# Patient Record
Sex: Female | Born: 1970
Health system: Southern US, Community
[De-identification: ages and names within clinical notes are randomized; demographics above are authoritative.]

## PROBLEM LIST (undated history)

## (undated) HISTORY — PX: LAPAROSCOPIC GASTRIC BANDING: SHX1100

---

## 1999-06-14 ENCOUNTER — Inpatient Hospital Stay (HOSPITAL_COMMUNITY): Admission: EM | Admit: 1999-06-14 | Discharge: 1999-06-14 | Payer: Self-pay | Admitting: Obstetrics

## 2004-02-29 ENCOUNTER — Emergency Department (HOSPITAL_COMMUNITY): Admission: EM | Admit: 2004-02-29 | Discharge: 2004-02-29 | Payer: Self-pay | Admitting: Emergency Medicine

## 2007-03-17 ENCOUNTER — Emergency Department (HOSPITAL_COMMUNITY): Admission: EM | Admit: 2007-03-17 | Discharge: 2007-03-17 | Payer: Self-pay | Admitting: Emergency Medicine

## 2007-05-29 ENCOUNTER — Encounter: Admission: RE | Admit: 2007-05-29 | Discharge: 2007-05-29 | Payer: Self-pay | Admitting: Internal Medicine

## 2007-08-29 ENCOUNTER — Ambulatory Visit (HOSPITAL_COMMUNITY): Admission: RE | Admit: 2007-08-29 | Discharge: 2007-08-29 | Payer: Self-pay | Admitting: Obstetrics

## 2007-08-29 ENCOUNTER — Encounter (INDEPENDENT_AMBULATORY_CARE_PROVIDER_SITE_OTHER): Payer: Self-pay | Admitting: Obstetrics

## 2007-12-12 ENCOUNTER — Emergency Department (HOSPITAL_COMMUNITY): Admission: EM | Admit: 2007-12-12 | Discharge: 2007-12-12 | Payer: Self-pay | Admitting: Emergency Medicine

## 2008-09-16 ENCOUNTER — Emergency Department (HOSPITAL_COMMUNITY): Admission: EM | Admit: 2008-09-16 | Discharge: 2008-09-16 | Payer: Self-pay | Admitting: Emergency Medicine

## 2008-09-23 ENCOUNTER — Emergency Department (HOSPITAL_COMMUNITY): Admission: EM | Admit: 2008-09-23 | Discharge: 2008-09-23 | Payer: Self-pay | Admitting: Emergency Medicine

## 2010-12-14 NOTE — Op Note (Signed)
Tiffany Ford, Tiffany Ford               ACCOUNT NO.:  [id number]   MEDICAL RECORD NO.:  [id number]          PATIENT TYPE:  AMB   LOCATION:  [facility]                           FACILITY:  [facility]   PHYSICIAN:  Kathreen Cosier, M.D.DATE OF BIRTH:  Dec 16, 1970   DATE OF PROCEDURE:  08/29/2007  DATE OF DISCHARGE:                               OPERATIVE REPORT   PREOPERATIVE DIAGNOSIS:  Dysfunctional bleeding.   PROCEDURE:  Hysteroscopy, D and C, NovaSure ablation.   SURGEON:  Kathreen Cosier, M.D.   ANESTHESIA:  MAC.   Using MAC with the patient in lithotomy position, perineum and vagina  prepped and draped, bladder emptied with straight catheter.  Bimanual  exam revealed the uterus to be enlarged with some myomas.  Speculum  placed in the vagina.  Cervix injected with 9 mL of 1% Xylocaine.  Endocervix curetted, small amount of tissue obtained.  Endometrial  cavity sounded 10 cm.  Cervical length measured 5 cm.  Cervix dilated  #27 Shawnie Pons.  Hysteroscope inserted, cavity was normal.  Hysteroscope  removed and sharp curettage done, large amount of tissue obtained.  The  NovaSure device was then inserted and the cavity integrity test passed.  Cavity width was 5 cm.  Ablation was performed for 64 seconds at 138  watts.  Post ablation hysteroscopy performed.  Cavity showed total  ablation.  The fluid deficit was 80 mL.  The patient tolerated the  procedure well, taken to the recovery room in good condition.           ______________________________  Kathreen Cosier, M.D.     BAM/MEDQ  D:  08/29/2007  T:  08/29/2007  Job:  [id number]

## 2011-04-21 LAB — CBC
HCT: 34 — ABNORMAL LOW
Hemoglobin: 11.3 — ABNORMAL LOW
Platelets: 292
RBC: 3.82 — ABNORMAL LOW
WBC: 7.4

## 2011-04-21 LAB — DIFFERENTIAL
Basophils Absolute: 0
Basophils Relative: 1
Eosinophils Absolute: 0.1
Eosinophils Relative: 1
Monocytes Absolute: 0.3

## 2015-06-04 ENCOUNTER — Other Ambulatory Visit: Payer: Self-pay

## 2015-06-04 DIAGNOSIS — Z1231 Encounter for screening mammogram for malignant neoplasm of breast: Secondary | ICD-10-CM

## 2015-07-03 ENCOUNTER — Ambulatory Visit: Payer: Self-pay

## 2015-08-07 ENCOUNTER — Ambulatory Visit
Admission: RE | Admit: 2015-08-07 | Discharge: 2015-08-07 | Disposition: A | Discharge status: Home / Self Care | Payer: BLUE CROSS/BLUE SHIELD | Source: Ambulatory Visit

## 2015-08-07 DIAGNOSIS — Z1231 Encounter for screening mammogram for malignant neoplasm of breast: Secondary | ICD-10-CM

## 2015-10-08 ENCOUNTER — Other Ambulatory Visit (HOSPITAL_COMMUNITY): Payer: Self-pay | Admitting: General Surgery

## 2015-10-23 ENCOUNTER — Ambulatory Visit (HOSPITAL_COMMUNITY)
Admission: RE | Admit: 2015-10-23 | Discharge: 2015-10-23 | Disposition: A | Discharge status: Home / Self Care | Payer: BLUE CROSS/BLUE SHIELD | Source: Ambulatory Visit | Attending: General Surgery | Admitting: General Surgery

## 2015-10-23 DIAGNOSIS — Z01818 Encounter for other preprocedural examination: Secondary | ICD-10-CM | POA: Insufficient documentation

## 2015-11-09 ENCOUNTER — Encounter: Payer: Self-pay | Admitting: Dietician

## 2015-11-09 ENCOUNTER — Encounter: Payer: BLUE CROSS/BLUE SHIELD | Attending: General Surgery | Admitting: Dietician

## 2015-11-09 NOTE — Patient Instructions (Signed)
Follow Pre-Op Goals Try Protein Shakes Call [facility] at [phone] when surgery is scheduled to enroll in Pre-Op Class  Things to remember:  Please always be honest with us. We want to support you!  If you have any questions or concerns in between appointments, please call or email Liz, Leslie, or Laurie.  The diet after surgery will be high protein and low in carbohydrate.  Vitamins and calcium need to be taken for the rest of your life.  Feel free to include support people in any classes or appointments.   Supplement recommendations:  Complete" Multivitamin: Sleeve Gastrectomy and RYGB patients take a double dose of MVI. LAGB patients take single dose as it is written on the package. Vitamin must be liquid or chewable but not gummy. Examples of these include Flintstones Complete and Centrum Complete. If the vitamin is bariatric-specific, take 1 dose as it is already formulated for bariatric surgery patients. Examples of these are Bariatric Advantage, Celebrate, and Wellesse. These can be found at the [facility] and/or online.     Calcium citrate: 1500 mg/day of Calcium citrate (also chewable or liquid) is recommended for all procedures. The body is only able to absorb 500-600 mg of Calcium at one time so 3 daily doses of 500 mg are recommended. Calcium doses must be taken a minimum of 2 hours apart. Additionally, Calcium must be taken 2 hours apart from iron-containing MVI. Examples of brands include Celebrate, Bariatric Advantage, and Wellesse. These brands must be purchased online or at the [facility]. Citracal Petites is the only Calcium citrate supplement found in general grocery stores and pharmacies. This is in tablet form and may be recommended for patients who do not tolerate chewable Calcium.  Continued or added Vitamin D supplementation based on individual needs.    Vitamin B12: 300-500 mcg/day for Sleeve Gastrectomy and RYGB. Optional for  LAGB patients as stomach remains fully intact. Must be taken intramuscularly, sublingually, or inhaled nasally. Oral route is not recommended. 

## 2015-11-09 NOTE — Progress Notes (Signed)
  Pre-Op Assessment Visit:  Pre-Operative Sleeve Gastrectomy Surgery  Medical Nutrition Therapy:  Appt start time: 0725 End time:  0750.  Patient was seen on 11/09/2015 for Pre-Operative Nutrition Assessment. Assessment and letter of approval [facility] coordinator on 11/09/2015.   Preferred Learning Style:   No preference indicated   Learning Readiness:   Ready  Handouts given during visit include:  Pre-Op Goals Bariatric Surgery Protein Shakes   During the appointment today the following Pre-Op Goals were reviewed with the patient: Maintain or lose weight as instructed by your surgeon Make healthy food choices Begin to limit portion sizes Limited concentrated sugars and fried foods Keep fat/sugar in the single digits per serving on   food labels Practice CHEWING your food  (aim for 30 chews per bite or until applesauce consistency) Practice not drinking 15 minutes before, during, and 30 minutes after each meal/snack Avoid all carbonated beverages  Avoid/limit caffeinated beverages  Avoid all sugar-sweetened beverages Consume 3 meals per day; eat every 3-5 hours Make a list of non-food related activities Aim for 64-100 ounces of FLUID daily  Aim for at least 60-80 grams of PROTEIN daily Look for a liquid protein source that contain ?15 g protein and ?5 g carbohydrate  (ex: shakes, drinks, shots)  Patient-Centered Goals: Would like to be more active with kids and feel less self conscious 10 confidence/10 importance scale   Demonstrated degree of understanding via:  Teach Back  Teaching Method Utilized:  Visual Auditory Hands on  Barriers to learning/adherence to lifestyle change: none  [facility] to [facility] the [facility] to enroll in Pre-Op and Post-Op Nutrition Education when surgery date is scheduled.

## 2016-09-13 ENCOUNTER — Other Ambulatory Visit: Payer: Self-pay | Admitting: Internal Medicine

## 2016-09-13 DIAGNOSIS — Z1231 Encounter for screening mammogram for malignant neoplasm of breast: Secondary | ICD-10-CM

## 2016-09-28 ENCOUNTER — Ambulatory Visit
Admission: RE | Admit: 2016-09-28 | Discharge: 2016-09-28 | Disposition: A | Discharge status: Home / Self Care | Payer: BLUE CROSS/BLUE SHIELD | Source: Ambulatory Visit | Attending: Internal Medicine | Admitting: Internal Medicine

## 2016-09-28 DIAGNOSIS — Z1231 Encounter for screening mammogram for malignant neoplasm of breast: Secondary | ICD-10-CM

## 2016-12-29 ENCOUNTER — Telehealth: Payer: Self-pay | Admitting: Podiatry

## 2016-12-29 ENCOUNTER — Ambulatory Visit (INDEPENDENT_AMBULATORY_CARE_PROVIDER_SITE_OTHER): Payer: BLUE CROSS/BLUE SHIELD

## 2016-12-29 ENCOUNTER — Ambulatory Visit (INDEPENDENT_AMBULATORY_CARE_PROVIDER_SITE_OTHER): Payer: BLUE CROSS/BLUE SHIELD | Admitting: Podiatry

## 2016-12-29 DIAGNOSIS — M722 Plantar fascial fibromatosis: Secondary | ICD-10-CM | POA: Diagnosis not present

## 2016-12-29 DIAGNOSIS — R52 Pain, unspecified: Secondary | ICD-10-CM | POA: Diagnosis not present

## 2016-12-29 DIAGNOSIS — M7750 Other enthesopathy of unspecified foot: Secondary | ICD-10-CM | POA: Diagnosis not present

## 2016-12-29 DIAGNOSIS — M19071 Primary osteoarthritis, right ankle and foot: Secondary | ICD-10-CM

## 2016-12-29 DIAGNOSIS — M779 Enthesopathy, unspecified: Secondary | ICD-10-CM

## 2016-12-29 MED ORDER — MELOXICAM 15 MG PO TABS: 15.0000 mg | ORAL_TABLET | Freq: Every day | ORAL | 2 refills | Status: DC

## 2016-12-29 MED ORDER — TRIAMCINOLONE ACETONIDE 10 MG/ML IJ SUSP: 10.0000 mg | Freq: Once | INTRAMUSCULAR | Status: DC

## 2016-12-29 NOTE — Telephone Encounter (Addendum)
Left message informing pt the Meloxicam had been ordered [facility] before 5:00[facility]. Pt called states she could not hear the message due to static, and I informed pt the rx had been called to her pharmacy.

## 2016-12-29 NOTE — Telephone Encounter (Signed)
I saw Dr. Wagoner this morning and he was supposed to call in Meloxicam (Mobic) Tiffany Anton15 MG tablet [facility] [address] and I wanted to know when it is going to be called in so I can go pick it up. Please call me back.

## 2016-12-29 NOTE — Addendum Note (Signed)
Addended by: Ovid CurdWAGONER, MATTHEW R on: 12/29/2016 12:53 PM   Modules accepted: Orders

## 2016-12-29 NOTE — Progress Notes (Addendum)
Subjective:    Patient ID: Tiffany Ford, female   DOB: 46 y.o.   MRN: [id number]   HPI 46 year old female presents the office today for concerns of foot pain on the right foot. She states this has been ongoing for greater than one year and she points to the top of her foot on the medial aspect where she is majority discomfort. Chest is some pain on the inside aspect of her ankle. She denies any recent injury or trauma. She has a remote history of ankle sprain back in her 4120s but not recently. She denies any numbness or tingling. The pain does not wake up at night. She denies any claudication symptoms. She has no other concerns today.   Review of Systems  All other systems reviewed and are negative.       Objective:  Physical Exam General: AAO x3, NAD  Dermatological: Skin is warm, dry and supple bilateral. Nails x 10 are well manicured; remaining integument appears unremarkable at this time. There are no open sores, no preulcerative lesions, no rash or signs of infection present.  Vascular: Dorsalis Pedis artery and Posterior Tibial artery pedal pulses are 2/4 bilateral with immedate capillary fill time. Pedal hair growth present. No varicosities and no lower extremity edema present bilateral. There is no pain with calf compression, swelling, warmth, erythema.   Neruologic: Grossly intact via light touch bilateral. Vibratory intact via tuning fork bilateral. Protective threshold with Semmes Wienstein monofilament intact to all pedal sites bilateral. Patellar and Achilles deep tendon reflexes 2+ bilateral. No Babinski or clonus noted bilateral.   Musculoskeletal:There is tenderness on the dorsal aspect of the medial right foot on the talonavicular joint and small palpable exostosis is probable off this area. There is some trace edema in this area without any erythema or increase in warmth. There is also some mild tenderness along the course of the posterior tibial tendon just posterior and  inferior to the medial malleolus. There is trace edema to this area but is no erythema or increase in warmth. Decrease in medial arch. Equinus present. There is minimal discomfort with sesamoid complex but no swelling or redness. There is minimal discomfort on the medial arch of the foot on the plantar fascia No evidence of acute fracture. MMT 5/5.  Assessment: Right talonavicular joint osteoarthritis, posterior tibial tendon dysfunction due to flatfoot deformity; with sesamoiditis and mild plantar fasciitis  Plan: -Treatment options discussed including all alternatives, risks, and complications -Etiology of symptoms were discussed -X-rays were obtained and reviewed with the patient. Arthritic changes present in the talar navicular joint. No evidence of acute fracture. -Discussed steroid injection therapy tenderness and she wishes to proceed. Under sterile conditions a mature Kenalog and local anesthetic was infiltrated into the area of maximal tenderness without complications. Post injection care was discussed. -Tri-Lock ankle brace was dispensed Prescribed mobic. Discussed side effects of the medication and directed to stop if any are to occur and call the office.  -She was molded for orthotics today. Orthotics ordered and [facility].  -RTC in 3 weeks or sooner if needed.  Ovid CurdMatthew Avry Roedl, DPM

## 2017-01-19 ENCOUNTER — Encounter: Payer: Self-pay | Admitting: Podiatry

## 2017-01-19 ENCOUNTER — Ambulatory Visit (INDEPENDENT_AMBULATORY_CARE_PROVIDER_SITE_OTHER): Payer: BLUE CROSS/BLUE SHIELD | Admitting: Podiatry

## 2017-01-19 DIAGNOSIS — M722 Plantar fascial fibromatosis: Secondary | ICD-10-CM

## 2017-01-19 DIAGNOSIS — M19071 Primary osteoarthritis, right ankle and foot: Secondary | ICD-10-CM

## 2017-01-19 NOTE — Patient Instructions (Signed)

## 2017-01-19 NOTE — Progress Notes (Signed)
Patient presents today to [facility]. She was seen by the assistant today and she declined an appointment with myself today. Recommended 4 week follow-up to check inserts or sooner if any questions or isssues arise. Directed on on how to break-in the inserts.   Ovid CurdMatthew Yuriel Lopezmartinez, DPM

## 2017-02-16 ENCOUNTER — Ambulatory Visit: Payer: BLUE CROSS/BLUE SHIELD | Admitting: Podiatry

## 2017-02-27 ENCOUNTER — Ambulatory Visit: Payer: BLUE CROSS/BLUE SHIELD | Admitting: Podiatry

## 2017-06-08 ENCOUNTER — Ambulatory Visit: Payer: Self-pay | Admitting: General Surgery

## 2017-07-07 ENCOUNTER — Other Ambulatory Visit (HOSPITAL_COMMUNITY): Payer: Self-pay | Admitting: General Surgery

## 2017-07-26 ENCOUNTER — Ambulatory Visit (HOSPITAL_COMMUNITY)
Admission: RE | Admit: 2017-07-26 | Discharge: 2017-07-26 | Disposition: A | Discharge status: Home / Self Care | Payer: BLUE CROSS/BLUE SHIELD | Source: Ambulatory Visit | Attending: General Surgery | Admitting: General Surgery

## 2017-07-26 ENCOUNTER — Other Ambulatory Visit: Payer: Self-pay

## 2017-07-26 DIAGNOSIS — K8020 Calculus of gallbladder without cholecystitis without obstruction: Secondary | ICD-10-CM | POA: Diagnosis not present

## 2017-07-26 DIAGNOSIS — R9431 Abnormal electrocardiogram [ECG] [EKG]: Secondary | ICD-10-CM | POA: Insufficient documentation

## 2017-07-26 DIAGNOSIS — Z01810 Encounter for preprocedural cardiovascular examination: Secondary | ICD-10-CM | POA: Insufficient documentation

## 2017-07-26 DIAGNOSIS — K9509 Other complications of gastric band procedure: Secondary | ICD-10-CM | POA: Insufficient documentation

## 2017-07-31 ENCOUNTER — Encounter: Payer: BLUE CROSS/BLUE SHIELD | Attending: General Surgery | Admitting: Registered"

## 2017-07-31 ENCOUNTER — Encounter: Payer: Self-pay | Admitting: Registered"

## 2017-07-31 ENCOUNTER — Encounter (HOSPITAL_COMMUNITY): Payer: Self-pay | Admitting: *Deleted

## 2017-07-31 ENCOUNTER — Other Ambulatory Visit: Payer: Self-pay

## 2017-07-31 ENCOUNTER — Ambulatory Visit (HOSPITAL_COMMUNITY)
Admission: EM | Admit: 2017-07-31 | Discharge: 2017-07-31 | Disposition: A | Discharge status: Home / Self Care | Payer: BLUE CROSS/BLUE SHIELD | Attending: Emergency Medicine | Admitting: Emergency Medicine

## 2017-07-31 DIAGNOSIS — E669 Obesity, unspecified: Secondary | ICD-10-CM

## 2017-07-31 DIAGNOSIS — S76219A Strain of adductor muscle, fascia and tendon of unspecified thigh, initial encounter: Secondary | ICD-10-CM | POA: Diagnosis not present

## 2017-07-31 DIAGNOSIS — Z713 Dietary counseling and surveillance: Secondary | ICD-10-CM | POA: Insufficient documentation

## 2017-07-31 MED ORDER — NAPROXEN 375 MG PO TABS: 375.0000 mg | ORAL_TABLET | Freq: Two times a day (BID) | ORAL | 0 refills | Status: DC

## 2017-07-31 MED ORDER — TRAMADOL HCL 50 MG PO TABS: 50.0000 mg | ORAL_TABLET | Freq: Four times a day (QID) | ORAL | 0 refills | Status: DC | PRN

## 2017-07-31 NOTE — ED Provider Notes (Signed)
[facility]    CSN: [id number] Arrival date & time: 07/31/17  1101     History   Chief Complaint No chief complaint on file.   HPI Tiffany Ford is a 46 y.o. female.   46 year old obese female states that about 2 months ago she was attempting to get into a [employer] and she felt a pulling in the muscle of the right inner thigh. She saw her doctor, obtained a pelvic x-ray read negative. She thought she was getting a little better, was taking meloxicam that more recently after walking more than usual she has developed more pain. Pain is located to the medial aspect of the thigh and to the quadriceps.      History reviewed. No pertinent past medical history.  There are no active problems to display for this patient.   Past Surgical History:  Procedure Laterality Date  . LAPAROSCOPIC GASTRIC BANDING      OB History    No data available       Home Medications    Prior to Admission medications   Medication Sig Start Date End Date Taking? Authorizing Provider  phentermine 15 MG capsule Take 15 mg by mouth every morning.   Yes [provider]  naproxen (NAPROSYN) 375 MG tablet Take 1 tablet (375 mg total) by mouth 2 (two) times daily. 07/31/17   Hayden RasmussenMabe, Nevan Creighton, NP  traMADol (ULTRAM) 50 MG tablet Take 1 tablet (50 mg total) by mouth every 6 (six) hours as needed. 07/31/17   Hayden RasmussenMabe, Amaliya Whitelaw, NP    Family History Family History  Problem Relation Age of Onset  . Hypertension Other   . Diabetes Other   . Heart disease Other     Social History Social History   Tobacco Use  . Smoking status: Never Smoker  . Smokeless tobacco: Never Used  Substance Use Topics  . Alcohol use: No    Frequency: Never  . Drug use: No     Allergies   Patient has no known allergies.   Review of Systems Review of Systems  Constitutional: Negative.  Negative for activity change, chills and fever.  HENT: Negative.   Respiratory: Negative.   Cardiovascular: Negative.     Musculoskeletal: Positive for myalgias.       As per HPI  Skin: Negative for color change, pallor and rash.  Neurological: Negative.   All other systems reviewed and are negative.    Physical Exam Triage Vital Signs ED Triage Vitals  Enc Vitals Group     BP 07/31/17 1206 136/64     Pulse Rate 07/31/17 1206 70     Resp --      Temp 07/31/17 1206 98.4 F (36.9 C)     Temp Source 07/31/17 1206 Oral     SpO2 07/31/17 1206 100 %     Weight --      Height --      Head Circumference --      Peak Flow --      Pain Score 07/31/17 1207 9     Pain Loc --      Pain Edu? --      Excl. in GC? --    No data found.  Updated Vital Signs BP 136/64 (BP Location: Left Arm)   Pulse 70   Temp 98.4 F (36.9 C) (Oral)   LMP 07/05/2017   SpO2 100%   Visual Acuity Right Eye Distance:   Left Eye Distance:   Bilateral Distance:  Right Eye Near:   Left Eye Near:    Bilateral Near:     Physical Exam  Constitutional: She is oriented to person, place, and time. She appears well-developed and well-nourished. No distress.  HENT:  Head: Normocephalic and atraumatic.  Eyes: EOM are normal. Pupils are equal, round, and reactive to light.  Neck: Normal range of motion. Neck supple.  Musculoskeletal: She exhibits tenderness. She exhibits no edema or deformity.  Tenderness over the right rock small adductor muscles. Minor tenderness over the quadriceps. Able to flex at the hip. No bony tenderness.  Lymphadenopathy:    She has no cervical adenopathy.  Neurological: She is alert and oriented to person, place, and time. No cranial nerve deficit.  Skin: Skin is warm and dry.  Nursing note and vitals reviewed.    UC Treatments / Results  Labs (all labs ordered are listed, but only abnormal results are displayed) Labs Reviewed - No data to display  EKG  EKG Interpretation None       Radiology No results found.  Procedures Procedures (including critical care time)  Medications  Ordered in UC Medications - No data to display   Initial Impression / Assessment and Plan / UC Course  I have reviewed the triage vital signs and the nursing notes.  Pertinent labs & imaging results that were available during my care of the patient were reviewed by me and considered in my medical decision making (see chart for details).    Less walking. Apply heat as directed take medication as directed and follow-up with primary care provider.    Final Clinical Impressions(s) / UC Diagnoses   Final diagnoses:  Strain of adductor muscle of thigh    ED Discharge Orders        Ordered    naproxen (NAPROSYN) 375 MG tablet  2 times daily     07/31/17 1256    traMADol (ULTRAM) 50 MG tablet  Every 6 hours PRN     07/31/17 1256       Controlled Substance Prescriptions [place] Controlled Substance Registry consulted? Not Applicable   Hayden RasmussenMabe, Jen Benedict, NP 07/31/17 1258

## 2017-07-31 NOTE — ED Triage Notes (Signed)
Per pt her right lower pelvic is hurting, per pt she went to PCP and they did xray and was given meds for this pain, per pt she is still having shooting pain that's radiating to her rt knee,

## 2017-07-31 NOTE — Discharge Instructions (Signed)
Less walking. Apply heat as directed take medication as directed and follow-up with primary care provider.

## 2017-07-31 NOTE — Progress Notes (Signed)
Pre-Op Assessment Visit:  Pre-Operative LAGB to Sleeve gastrectomy Surgery  Medical Nutrition Therapy:  Appt start time: 9:00  End time:  10:05  Patient was seen on 07/31/2017 for Pre-Operative Nutrition Assessment. Assessment and letter of approval faxed to [facility] coordinator on 07/31/2017.   Pt expectation of surgery: wants to healthy  Pt expectation of Dietitian: help along the way; do's and don'ts; restrictions  Start weight at [facility]: 202.1 BMI: 41.17   Pt states LAGB bothers her really bad; very uncomfortable. Pt wants what is best for her, not substitutions for shakes, etc.   Pt is unsure of the amount of visits needed, if any. Pt states she will find out from [facility].   24 hr Dietary Recall: First Meal: sometimes skips; sausage link Snack: none Second Meal: sometimes skips; 2 drummettes, okra Snack: none Third Meal: pig feet, rice, steamed cabbage, cornbread Snack: chocolate Beverages: water, Vitamin water, coffee, Premier protein clear drink  Encouraged to engage in 75 minutes of moderate physical activity including cardiovascular and weight baring weekly  Handouts given during visit include:  . Pre-Op Goals . Bariatric Surgery Protein Shakes . Vitamin and Mineral Recommendations  During the appointment today the following Pre-Op Goals were reviewed with the patient: . Track your food and beverage: MyFitness Pal or Baritastic App . Make healthy food choices . Begin to limit portion sizes . Limited concentrated sugars and fried foods . Keep fat/sugar in the single digits per serving on          food labels . Practice CHEWING your food  (aim for 30 chews per bite or until applesauce consistency) . Practice not drinking 15 minutes before, during, and 30 minutes after each meal/snack . Avoid all carbonated beverages  . Avoid/limit caffeinated beverages  . Avoid all sugar-sweetened beverages . Avoid alcohol . Consume 3 meals per  day; eat every 3-5 hours . Make a list of non-food related activities . Aim for 64-100 ounces of FLUID daily  . Aim for at least 60-80 grams of PROTEIN daily . Look for a liquid protein source that contain ?15 g protein and ?5 g carbohydrate  (ex: shakes, drinks, shots) . Physical activity is an important part of a healthy lifestyle so keep it moving!  Follow diet recommendations listed below Energy and Macronutrient Recommendations: Calories: 1600 Carbohydrate: 180 Protein: 120 Fat: 44  Demonstrated degree of understanding via:  Teach Back   Teaching Method Utilized:  Visual Auditory Hands on  Barriers to learning/adherence to lifestyle change: none  Patient to call the [facility] to enroll in Pre-Op and Post-Op Nutrition Education when surgery date is scheduled.

## 2017-08-22 ENCOUNTER — Other Ambulatory Visit: Payer: Self-pay | Admitting: Internal Medicine

## 2017-08-22 DIAGNOSIS — Z1231 Encounter for screening mammogram for malignant neoplasm of breast: Secondary | ICD-10-CM

## 2017-09-02 ENCOUNTER — Ambulatory Visit (HOSPITAL_COMMUNITY)
Admission: EM | Admit: 2017-09-02 | Discharge: 2017-09-02 | Disposition: A | Discharge status: Home / Self Care | Payer: BLUE CROSS/BLUE SHIELD | Attending: Emergency Medicine | Admitting: Emergency Medicine

## 2017-09-02 ENCOUNTER — Other Ambulatory Visit: Payer: Self-pay

## 2017-09-02 ENCOUNTER — Encounter (HOSPITAL_COMMUNITY): Payer: Self-pay | Admitting: *Deleted

## 2017-09-02 DIAGNOSIS — R6889 Other general symptoms and signs: Secondary | ICD-10-CM | POA: Diagnosis not present

## 2017-09-02 MED ORDER — ACETAMINOPHEN 325 MG PO TABS
650.0000 mg | ORAL_TABLET | Freq: Once | ORAL | Status: AC
Start: 2017-09-02 — End: 2017-09-02
  Administered 2017-09-02: 650 mg via ORAL

## 2017-09-02 MED ORDER — OSELTAMIVIR PHOSPHATE 75 MG PO CAPS: 75.0000 mg | ORAL_CAPSULE | Freq: Two times a day (BID) | ORAL | 0 refills | Status: DC

## 2017-09-02 MED ORDER — ACETAMINOPHEN 325 MG PO TABS
ORAL_TABLET | ORAL | Status: AC
  Filled 2017-09-02: qty 2

## 2017-09-02 NOTE — ED Provider Notes (Signed)
[facility]    CSN: [id number] Arrival date & time: 09/02/17  1618     History   Chief Complaint Chief Complaint  Patient presents with  . Headache  . Chills    HPI Tiffany Ford is a 47 y.o. female.  patient is presenting today with generalized malaise with headache, cough, aches, chills and chest congestion for approximately 2 days. Patient states she's had no nausea vomiting or diarrhea. Patient states that her daughter was recently diagnosed with flu and she thinks she "gave it to me". States she's not been taking anything other than Mucinex for her symptoms with no relief.  HPI  History reviewed. No pertinent past medical history.  There are no active problems to display for this patient.   Past Surgical History:  Procedure Laterality Date  . LAPAROSCOPIC GASTRIC BANDING      OB History    No data available       Home Medications    Prior to Admission medications   Medication Sig Start Date End Date Taking? Authorizing Provider  naproxen (NAPROSYN) 375 MG tablet Take 1 tablet (375 mg total) by mouth 2 (two) times daily. 07/31/17   Hayden Rasmussen, NP  oseltamivir (TAMIFLU) 75 MG capsule Take 1 capsule (75 mg total) by mouth every 12 (twelve) hours. 09/02/17   Servando Salina, NP  phentermine 15 MG capsule Take 15 mg by mouth every morning.    [provider]  traMADol (ULTRAM) 50 MG tablet Take 1 tablet (50 mg total) by mouth every 6 (six) hours as needed. 07/31/17   Hayden Rasmussen, NP    Family History Family History  Problem Relation Age of Onset  . Hypertension Other   . Diabetes Other   . Heart disease Other     Social History Social History   Tobacco Use  . Smoking status: Never Smoker  . Smokeless tobacco: Never Used  Substance Use Topics  . Alcohol use: No    Frequency: Never  . Drug use: No     Allergies   Patient has no known allergies.   Review of Systems Review of Systems  Constitutional: Positive for chills,  fatigue and fever.  HENT: Positive for congestion.   Eyes: Negative.  Negative for visual disturbance.  Respiratory: Positive for cough. Negative for shortness of breath and wheezing.   Cardiovascular: Negative.  Negative for chest pain and leg swelling.  Gastrointestinal: Negative.  Negative for abdominal pain, constipation, diarrhea, nausea and vomiting.  Endocrine: Negative.   Genitourinary: Negative.   Musculoskeletal: Negative.  Negative for gait problem and neck stiffness.  Skin: Negative.  Negative for rash.  Allergic/Immunologic: Negative.   Neurological: Negative.  Negative for dizziness and headaches.  Hematological: Negative.   Psychiatric/Behavioral: Negative.      Physical Exam Triage Vital Signs ED Triage Vitals  Enc Vitals Group     BP 09/02/17 1712 (!) 161/98     Pulse Rate 09/02/17 1712 78     Resp 09/02/17 1712 (!) 24     Temp 09/02/17 1712 99.6 F (37.6 C)     Temp Source 09/02/17 1712 Oral     SpO2 09/02/17 1712 98 %     Weight --      Height --      Head Circumference --      Peak Flow --      Pain Score 09/02/17 1715 10     Pain Loc --      Pain Edu? --  Excl. in GC? --    No data found.  Updated Vital Signs BP (!) 161/98 (BP Location: Left Arm) Comment: Notified Octavia  Pulse 78   Temp 98.6 F (37 C) (Oral)   Resp (!) 24 Comment: Notified Octavia  SpO2 98%       Physical Exam  Constitutional: She appears well-developed and well-nourished. No distress.  HENT:  Head: Normocephalic and atraumatic.  Mouth/Throat: Oropharynx is clear and moist.  Eyes: Conjunctivae are normal. Right eye exhibits no discharge. Left eye exhibits no discharge.  Neck: Normal range of motion. Neck supple. No thyromegaly present.  Negative cervical lymphadenopathy. Negative for nuchal rigidity  Cardiovascular: Normal rate, regular rhythm, normal heart sounds and intact distal pulses. Exam reveals no gallop and no friction rub.  No murmur  heard. Pulmonary/Chest: Effort normal and breath sounds normal. No stridor. No respiratory distress. She has no wheezes. She has no rales. She exhibits no tenderness.  Abdominal: Soft. Bowel sounds are normal. She exhibits no distension and no mass. There is no tenderness. There is no guarding.  Neurological: She is alert. She has normal strength.  Skin: She is not diaphoretic.  Nursing note and vitals reviewed.    UC Treatments / Results  Labs (all labs ordered are listed, but only abnormal results are displayed) Labs Reviewed - No data to display  EKG  EKG Interpretation None       Radiology No results found.  Procedures Procedures (including critical care time)  Medications Ordered in UC Medications  acetaminophen (TYLENOL) tablet 650 mg (650 mg Oral Given 09/02/17 1733)     Initial Impression / Assessment and Plan / UC Course  I have reviewed the triage vital signs and the nursing notes.  Pertinent labs & imaging results that were available during my care of the patient were reviewed by me and considered in my medical decision making (see chart for details).   Discussed flu treatment options.  Advised patient not to return to work until 24 hours fever free.  Work note offered.   Final Clinical Impressions(s) / UC Diagnoses   Final diagnoses:  Flu-like symptoms    ED Discharge Orders        Ordered    oseltamivir (TAMIFLU) 75 MG capsule  Every 12 hours     09/02/17 1729       Controlled Substance Prescriptions [place] Controlled Substance Registry consulted? Not Applicable   The usual and customary discharge instructions and warnings were given.  The patient verbalizes understanding and agrees to plan of care.      Servando Salinaossi, Natale Thoma H, NP 09/02/17 1756

## 2017-09-02 NOTE — Discharge Instructions (Signed)
Do not return to work until fever free for 24 hours.

## 2017-09-02 NOTE — ED Triage Notes (Addendum)
Headache, sore throat, cough, chills, sneezing, Facial pressure, fatigue, some body aches,

## 2017-09-12 ENCOUNTER — Ambulatory Visit: Payer: BLUE CROSS/BLUE SHIELD | Admitting: Physical Therapy

## 2017-09-18 ENCOUNTER — Ambulatory Visit: Payer: BLUE CROSS/BLUE SHIELD | Attending: Internal Medicine | Admitting: Physical Therapy

## 2017-09-18 ENCOUNTER — Other Ambulatory Visit: Payer: Self-pay

## 2017-09-18 ENCOUNTER — Encounter: Payer: Self-pay | Admitting: Physical Therapy

## 2017-09-18 DIAGNOSIS — M6281 Muscle weakness (generalized): Secondary | ICD-10-CM | POA: Diagnosis present

## 2017-09-18 DIAGNOSIS — R262 Difficulty in walking, not elsewhere classified: Secondary | ICD-10-CM | POA: Diagnosis present

## 2017-09-18 DIAGNOSIS — R1031 Right lower quadrant pain: Secondary | ICD-10-CM | POA: Diagnosis present

## 2017-09-18 NOTE — Therapy (Signed)
[facility] Outpatient Rehabilitation [facility] [address] [place], [place], [address] Phone: [phone]   Fax:  [phone]  Physical Therapy Evaluation  Patient Details  Name: Tiffany Ford MRN: [id number] Date of Birth: Jun 22, 1971 Referring Provider: Fleet Contras, MD   Encounter Date: 09/18/2017  PT End of Session - 09/18/17 1123    Visit Number  1    Number of Visits  13    Date for PT Re-Evaluation  11/03/17    Authorization Type  BCBS 30 visit limit    PT Start Time  1123    PT Stop Time  1218    PT Time Calculation (min)  55 min    Activity Tolerance  Patient tolerated treatment well    Behavior During Therapy  [facility] for tasks assessed/performed       History reviewed. No pertinent past medical history.  Past Surgical History:  Procedure Laterality Date  . LAPAROSCOPIC GASTRIC BANDING      There were no vitals filed for this visit.   Subjective Assessment - 09/18/17 1129    Subjective  Feels pain in Rght groin that can be unbearable. Went to [place] in October and rented a car. When she placed her Rt foot on the side of the car to step in she felt the pull. Denies bruising following. Was told by MD at urgent care that she should be taking baby steps rather than long strides. Denies any change in pain since injury. Pain to knee that runs along whole thigh. Pain moves around to lateral hip but not to back.     How long can you sit comfortably?  unlimited    How long can you stand comfortably?  3 min    How long can you walk comfortably?  immediate discomfort    Patient Stated Goals  turn over in bed, stand to chart at work    Currently in Pain?  No/denies    Pain Score  -- up to 9/10 in last 3 days    Pain Location  -- thigh    Pain Orientation  Proximal;Mid    Pain Descriptors / Indicators  Sharp;Sore    Aggravating Factors   walking    Pain Relieving Factors  lay in bed         [facility] PT Assessment - 09/18/17 0001      Assessment    Medical Diagnosis  Rt hip and thigh pain    Referring Provider  Fleet Contras, MD    Onset Date/Surgical Date  -- Oct 2018    Hand Dominance  Right      Precautions   Precautions  None      Restrictions   Weight Bearing Restrictions  No      Balance Screen   Has the patient fallen in the past 6 months  No      Home Environment   Additional Comments  no stairs at home      Prior Function   Vocation Requirements  patient care at [employer]   Overall Cognitive Status  Within Functional Limits for tasks assessed      Observation/Other Assessments   Focus on Therapeutic Outcomes (FOTO)   -- episode was discharged prior to eval      Sensation   Additional Comments  WFL      ROM / Strength   AROM / PROM / Strength  PROM;Strength      PROM   Overall PROM  Comments  pain with passive adduction, end range flexion, ER, IR end ranges      Strength   Strength Assessment Site  Hip    Right/Left Hip  Right    Right Hip Flexion  3/5    Right Hip ABduction  4/5    Right Hip ADduction  -- pain      Palpation   Palpation comment  proximal adductor tendons TTP, pain with heel slide, no pain with resisted adduction, no TTP quad/adductor muscle bellies or illiopsoas      Special Tests   Other special tests  single leg- rt hip drop when standing on Lt leg, Rt lateral trunk lean in Rt SLS      Ambulation/Gait   Gait Comments  antalgic noted in Left stance phase with Rt hip drop             Objective measurements completed on examination: See above findings.      [facility] Adult PT Treatment/Exercise - 09/18/17 0001      Exercises   Exercises  Knee/Hip      Knee/Hip Exercises: Sidelying   Hip ABduction  Both;15 reps      Knee/Hip Exercises: Prone   Other Prone Exercises  abdominal activation + glut set      Modalities   Modalities  Cryotherapy      Cryotherapy   Number Minutes Cryotherapy  10 Minutes    Cryotherapy Location  Hip Rt groin    Type of  Cryotherapy  Ice pack             PT Education - 09/18/17 1236    Education provided  Yes    Education Details  anatomy of condition, POC, HEP, exercise form/rationale, modalities, rest breaks    Person(s) Educated  Patient    Methods  Explanation;Verbal cues;Handout    Comprehension  Verbalized understanding;Verbal cues required;Need further instruction          PT Long Term Goals - 09/18/17 1232      PT LONG TERM GOAL #1   Title  Gross LE strength to 5/5 for proper support to biomechanical chain    Baseline  see flowsheet    Time  6    Period  Weeks    Status  New    Target Date  11/03/17      PT LONG TERM GOAL #2   Title  Pt will be able to stand to chart at work without limitation by groin pain    Baseline  comfortable for about 3 minutes at eval    Time  6    Period  Weeks    Status  New    Target Date  11/03/17      PT LONG TERM GOAL #3   Title  Pt will report ability to "walk normally" and taking comfortable stride lengths    Baseline  taking baby steps in antalgic pattern    Time  6    Period  Weeks    Status  New    Target Date  11/03/17      PT LONG TERM GOAL #4   Title  Pt will be able to perform bed mobility and sit<>stand transfers without increased pain    Baseline  pain at eval    Time  6    Period  Weeks    Status  New    Target Date  11/03/17  Plan - 09/18/17 1220    Clinical Impression Statement  Pt presents to PT with complaints of Rt hip/thigh pain that began in October when she was stepping into a rented [place]. Pain with palpation to proximal adductor tendon and pain with stretching adductors. No insertion point pain upon palpation. S/s consistent with tendon strain. Notable weakness in Lt hip in CKC motions as well. Pt was asked to alternate ice and heat as well as take rest breaks at work to avoid reaching high levels of pain. Pt will benefit from skilled PT in order to stabilize lumbopelvic region to decrease unequal  stress on pelvis and manage pain in tendons.     Clinical Presentation  Stable    Clinical Decision Making  Low    Rehab Potential  Good    PT Frequency  2x / week    PT Duration  6 weeks    PT Treatment/Interventions  ADLs/Self Care Home Management;Cryotherapy;Electrical Stimulation;Ultrasound;Moist Heat;Gait training;Stair training;Functional mobility training;Therapeutic activities;Therapeutic exercise;Balance training;Patient/family education;Neuromuscular re-education;Manual techniques;Passive range of motion;Dry needling;Taping    PT Next Visit Plan  ultrasound proximal adductors. lumbopelvic stability    PT Home Exercise Plan  prone core/glut sets, sidelying hip abduction    Consulted and Agree with Plan of Care  Patient       Patient will benefit from skilled therapeutic intervention in order to improve the following deficits and impairments:  Abnormal gait, Pain, Improper body mechanics, Decreased activity tolerance, Decreased strength, Difficulty walking  Visit Diagnosis: Pain in the groin, right - Plan: PT plan of care cert/re-cert  Muscle weakness (generalized) - Plan: PT plan of care cert/re-cert  Difficulty in walking, not elsewhere classified - Plan: PT plan of care cert/re-cert     Problem List There are no active problems to display for this patient.  Tiffany Coin C. Tiffany Ford PT, DPT 09/18/17 12:41 PM   [facility] Outpatient Rehabilitation [facility] [address] [place], [place], [address] Phone: [phone]   Fax:  [phone]  Name: Tiffany Ford MRN: [id number] Date of Birth: 08/28/1970

## 2017-09-19 ENCOUNTER — Ambulatory Visit: Payer: BLUE CROSS/BLUE SHIELD | Admitting: Physical Therapy

## 2017-09-19 ENCOUNTER — Telehealth: Payer: Self-pay | Admitting: Physical Therapy

## 2017-09-19 NOTE — Telephone Encounter (Signed)
MESSAGE LEFT ON MACHINE ABOUT MISSED VISIT.  INFORMED OF HER NEXT APPOINTMENT.  PHONE NUMBER LEFT FOR her to call if she needs to cancel or no longer needs PT.  Referred her to the attendance policy for missed visits.   Liz BeachKaren harris PTA

## 2017-09-28 ENCOUNTER — Encounter: Payer: Self-pay | Admitting: Physical Therapy

## 2017-09-28 ENCOUNTER — Ambulatory Visit: Payer: BLUE CROSS/BLUE SHIELD | Admitting: Physical Therapy

## 2017-09-28 DIAGNOSIS — R262 Difficulty in walking, not elsewhere classified: Secondary | ICD-10-CM

## 2017-09-28 DIAGNOSIS — M6281 Muscle weakness (generalized): Secondary | ICD-10-CM

## 2017-09-28 DIAGNOSIS — R1031 Right lower quadrant pain: Secondary | ICD-10-CM

## 2017-09-28 NOTE — Therapy (Addendum)
[place], [place], [address] Phone: [phone]   Fax:  [phone]  Physical Therapy Treatment/Discharge Summary  Patient Details  Name: Tiffany Ford MRN: [id number] Date of Birth: July 18, 1971 Referring Provider: Nolene Ebbs, MD   Encounter Date: 09/28/2017  PT End of Session - 09/28/17 1059    Visit Number  2    Number of Visits  13    Date for PT Re-Evaluation  11/03/17    Authorization Type  BCBS 30 visit limit    PT Start Time  1103    PT Stop Time  1113    PT Time Calculation (min)  10 min    Activity Tolerance  Patient tolerated treatment well    Behavior During Therapy  [facility] for tasks assessed/performed       History reviewed. No pertinent past medical history.  Past Surgical History:  Procedure Laterality Date  . LAPAROSCOPIC GASTRIC BANDING      There were no vitals filed for this visit.  Subjective Assessment - 09/28/17 1103    Subjective  The ice pack was my best friend. I did not do exercises because I was so tired after work.     Patient Stated Goals  turn over in bed, stand to chart at work    Currently in Pain?  No/denies                      [facility] Adult PT Treatment/Exercise - 09/28/17 0001      Knee/Hip Exercises: Aerobic   Nustep  5 min L5 LE only                  PT Long Term Goals - 09/18/17 1232      PT LONG TERM GOAL #1   Title  Gross LE strength to 5/5 for proper support to biomechanical chain    Baseline  see flowsheet    Time  6    Period  Weeks    Status  New    Target Date  11/03/17      PT LONG TERM GOAL #2   Title  Pt will be able to stand to chart at work without limitation by groin pain    Baseline  comfortable for about 3 minutes at eval    Time  6    Period  Weeks    Status  New    Target Date  11/03/17      PT LONG TERM GOAL #3   Title  Pt will report ability to "walk normally" and taking comfortable stride  lengths    Baseline  taking baby steps in antalgic pattern    Time  6    Period  Weeks    Status  New    Target Date  11/03/17      PT LONG TERM GOAL #4   Title  Pt will be able to perform bed mobility and sit<>stand transfers without increased pain    Baseline  pain at eval    Time  6    Period  Weeks    Status  New    Target Date  11/03/17            Plan - 09/28/17 1113    Clinical Impression Statement  Pt ended session due to daughter being sick at school and had to pick her up.     PT Treatment/Interventions  ADLs/Self Care Home Management;Cryotherapy;Electrical Stimulation;Ultrasound;Moist Heat;Gait training;Stair  training;Functional mobility training;Therapeutic activities;Therapeutic exercise;Balance training;Patient/family education;Neuromuscular re-education;Manual techniques;Passive range of motion;Dry needling;Taping    PT Next Visit Plan  ultrasound proximal adductors. lumbopelvic stability    PT Home Exercise Plan  prone core/glut sets, sidelying hip abduction    Consulted and Agree with Plan of Care  Patient       Patient will benefit from skilled therapeutic intervention in order to improve the following deficits and impairments:  Abnormal gait, Pain, Improper body mechanics, Decreased activity tolerance, Decreased strength, Difficulty walking  Visit Diagnosis: Pain in the groin, right  Muscle weakness (generalized)  Difficulty in walking, not elsewhere classified     Problem List There are no active problems to display for this patient.   Ysabelle Goodroe C. Shaylen Nephew PT, DPT 09/28/17 11:16 AM   [facility] [address] [place], [place], [address] Phone: [phone]   Fax:  [phone]  Name: Tiffany Ford MRN: [id number] Date of Birth: 1971/07/12  PHYSICAL THERAPY DISCHARGE SUMMARY  Visits from Start of Care: 2  Current functional level related to goals / functional outcomes: See above    Remaining deficits: See above   Education / Equipment: Anatomy of condition, POC, HEP, exercise form/rationale  Plan: Patient agrees to discharge.  Patient goals were not met. Patient is being discharged due to not returning since the last visit.  ?????    Too many NS, d/c per attendance policy.  Giuseppina Quinones C. Ivin Rosenbloom PT, DPT 11/06/17 11:16 AM

## 2017-09-29 ENCOUNTER — Ambulatory Visit: Payer: BLUE CROSS/BLUE SHIELD

## 2017-10-02 ENCOUNTER — Telehealth: Payer: Self-pay | Admitting: Physical Therapy

## 2017-10-02 ENCOUNTER — Ambulatory Visit: Payer: BLUE CROSS/BLUE SHIELD | Attending: Internal Medicine | Admitting: Physical Therapy

## 2017-10-02 NOTE — Telephone Encounter (Signed)
Left message about 2nd no show visit. Informed her of the next scheduled appointment date and time.  Also informed her that all other  appointments will be cancelled and she will be able to schedule one appointment at a time.  Our number was given for her to call and cancel if she no longer needs PT or if she needs to cancel. Tiffany BeachKaren Veatrice Ford PTA

## 2017-10-05 ENCOUNTER — Ambulatory Visit: Payer: BLUE CROSS/BLUE SHIELD | Admitting: Physical Therapy

## 2017-10-09 ENCOUNTER — Encounter: Payer: BLUE CROSS/BLUE SHIELD | Admitting: Physical Therapy

## 2017-10-16 ENCOUNTER — Encounter: Payer: BLUE CROSS/BLUE SHIELD | Admitting: Physical Therapy

## 2017-10-19 ENCOUNTER — Encounter: Payer: BLUE CROSS/BLUE SHIELD | Admitting: Physical Therapy

## 2017-10-20 ENCOUNTER — Ambulatory Visit
Admission: RE | Admit: 2017-10-20 | Discharge: 2017-10-20 | Disposition: A | Discharge status: Home / Self Care | Payer: BLUE CROSS/BLUE SHIELD | Source: Ambulatory Visit | Attending: Internal Medicine | Admitting: Internal Medicine

## 2017-10-20 DIAGNOSIS — Z1231 Encounter for screening mammogram for malignant neoplasm of breast: Secondary | ICD-10-CM

## 2017-10-23 ENCOUNTER — Encounter: Payer: BLUE CROSS/BLUE SHIELD | Admitting: Physical Therapy

## 2017-10-26 ENCOUNTER — Encounter: Payer: BLUE CROSS/BLUE SHIELD | Admitting: Physical Therapy

## 2017-10-30 ENCOUNTER — Encounter: Payer: BLUE CROSS/BLUE SHIELD | Admitting: Physical Therapy

## 2017-11-02 ENCOUNTER — Encounter: Payer: BLUE CROSS/BLUE SHIELD | Admitting: Physical Therapy

## 2017-11-23 ENCOUNTER — Ambulatory Visit: Payer: Self-pay | Admitting: General Surgery

## 2017-11-24 NOTE — Progress Notes (Signed)
07-26-17 (Epic) EKG, CXR

## 2017-11-24 NOTE — Patient Instructions (Addendum)
Tiffany LittenSamantha N Ford  11/24/2017   Your procedure is scheduled on: 12-04-17   [facility]  Entrance Report to Admitting at 9:15 AM    Call this number if you have problems the morning of surgery [phone]   Remember: MORNING OF SURGERY DRINK:  1SHAKE BEFORE YOU LEAVE HOME, DRINK ALL OF THE SHAKE AT ONE TIME.   NO SOLID FOOD AFTER 600 PM THE NIGHT BEFORE YOUR SURGERY. YOU MAY DRINK CLEAR FLUIDS. THE SHAKE YOU DRINK BEFORE YOU LEAVE HOME WILL BE THE LAST FLUIDS YOU DRINK BEFORE SURGERY 8:15 AM.  PAIN IS EXPECTED AFTER SURGERY AND WILL NOT BE COMPLETELY ELIMINATED. AMBULATION AND TYLENOL WILL HELP REDUCE INCISIONAL AND GAS PAIN. MOVEMENT IS KEY!  YOU ARE EXPECTED TO BE OUT OF BED WITHIN 4 HOURS OF ADMISSION TO YOUR PATIENT ROOM.  SITTING IN THE RECLINER THROUGHOUT THE DAY IS IMPORTANT FOR DRINKING FLUIDS AND MOVING GAS THROUGHOUT THE GI TRACT.  COMPRESSION STOCKINGS SHOULD BE WORN [facility] UNLESS YOU ARE WALKING.   INCENTIVE SPIROMETER SHOULD BE USED EVERY HOUR WHILE AWAKE TO DECREASE POST-OPERATIVE COMPLICATIONS SUCH AS PNEUMONIA.  WHEN DISCHARGED HOME, IT IS IMPORTANT TO CONTINUE TO WALK EVERY HOUR AND USE THE INCENTIVE SPIROMETER EVERY HOUR.           Take these medicines the morning of surgery with A SIP OF WATER: None                      CLEAR LIQUID DIET   Foods Allowed                                                                     Foods Excluded  Coffee and tea, regular and decaf                             liquids that you cannot  Plain Jell-O in any flavor                                             see through such as: Fruit ices (not with fruit pulp)                                     milk, soups, orange juice  Iced Popsicles                                    All solid food Carbonated beverages, regular and diet                                    Cranberry, grape and apple juices Sports drinks like  Gatorade Lightly seasoned clear broth or consume(fat free) Sugar, honey syrup  Sample Menu Breakfast  Lunch                                     Supper Cranberry juice                    Beef broth                            Chicken broth Jell-O                                     Grape juice                           Apple juice Coffee or tea                        Jell-O                                      Popsicle                                                Coffee or tea                        Coffee or tea  _____________________________________________________________________               [name] may not have any metal on your body including hair pins and              piercings  Do not wear jewelry, make-up, lotions, powders or perfumes, deodorant             Do not wear nail polish.  Do not shave  48 hours prior to surgery.                 Do not bring valuables to the hospital. [name] IS NOT             RESPONSIBLE   FOR VALUABLES.  Contacts, dentures or bridgework may not be worn into surgery.  Leave suitcase in the car. After surgery it may be brought to your room.     Special Instructions: N/A              Please read over the following fact sheets you were given: _____________________________________________________________________             [facility] - Preparing for Surgery Before surgery, you can play an important role.  Because skin is not sterile, your skin needs to be as free of germs as possible.  You can reduce the number of germs on your skin by washing with CHG (chlorahexidine gluconate) soap before surgery.  CHG is an antiseptic cleaner which kills germs and bonds with the skin to continue killing germs even after washing. Please DO NOT use if you have an allergy to CHG or antibacterial soaps.  If your skin becomes reddened/irritated stop using the CHG and inform your nurse when you arrive at Short Stay. Do not  shave  (including legs and underarms) for at least 48 hours prior to the first CHG shower.  You may shave your face/neck. Please follow these instructions carefully:  1.  Shower with CHG Soap the night before surgery and the  morning of Surgery.  2.  If you choose to wash your hair, wash your hair first as usual with your  normal  shampoo.  3.  After you shampoo, rinse your hair and body thoroughly to remove the  shampoo.                           4.  Use CHG as you would any other liquid soap.  You can apply chg directly  to the skin and wash                       Gently with a scrungie or clean washcloth.  5.  Apply the CHG Soap to your body ONLY FROM THE NECK DOWN.   Do not use on face/ open                           Wound or open sores. Avoid contact with eyes, ears mouth and genitals (private parts).                       Wash face,  Genitals (private parts) with your normal soap.             6.  Wash thoroughly, paying special attention to the area where your surgery  will be performed.  7.  Thoroughly rinse your body with warm water from the neck down.  8.  DO NOT shower/wash with your normal soap after using and rinsing off  the CHG Soap.                9.  Pat yourself dry with a clean towel.            10.  Wear clean pajamas.            11.  Place clean sheets on your bed the night of your first shower and do not  sleep with pets. Day of Surgery : Do not apply any lotions/deodorants the morning of surgery.  Please wear clean clothes to the hospital/surgery center.  FAILURE TO FOLLOW THESE INSTRUCTIONS MAY RESULT IN THE CANCELLATION OF YOUR SURGERY PATIENT SIGNATURE_________________________________  NURSE SIGNATURE__________________________________  ________________________________________________________________________  WHAT IS A BLOOD TRANSFUSION? Blood Transfusion Information  A transfusion is the replacement of blood or some of its parts. Blood is made up of multiple cells which  provide different functions.  Red blood cells carry oxygen and are used for blood loss replacement.  White blood cells fight against infection.  Platelets control bleeding.  Plasma helps clot blood.  Other blood products are available for specialized needs, such as hemophilia or other clotting disorders. BEFORE THE TRANSFUSION  Who gives blood for transfusions?   Healthy volunteers who are fully evaluated to make sure their blood is safe. This is blood bank blood. Transfusion therapy is the safest it has ever been in the practice of medicine. Before blood is taken from a donor, a complete history is taken to make sure that person has no history of diseases nor engages in risky social behavior (examples are intravenous drug use or sexual activity with multiple partners). The donor's travel history is  screened to minimize risk of transmitting infections, such as malaria. The donated blood is tested for signs of infectious diseases, such as HIV and hepatitis. The blood is then tested to be sure it is compatible with you in order to minimize the chance of a transfusion reaction. If you or a relative donates blood, this is often done in anticipation of surgery and is not appropriate for emergency situations. It takes many days to process the donated blood. RISKS AND COMPLICATIONS Although transfusion therapy is very safe and saves many lives, the main dangers of transfusion include:   Getting an infectious disease.  Developing a transfusion reaction. This is an allergic reaction to something in the blood you were given. Every precaution is taken to prevent this. The decision to have a blood transfusion has been considered carefully by your caregiver before blood is given. Blood is not given unless the benefits outweigh the risks. AFTER THE TRANSFUSION  Right after receiving a blood transfusion, you will usually feel much better and more energetic. This is especially true if your red blood cells  have gotten low (anemic). The transfusion raises the level of the red blood cells which carry oxygen, and this usually causes an energy increase.  The nurse administering the transfusion will monitor you carefully for complications. HOME CARE INSTRUCTIONS  No special instructions are needed after a transfusion. You may find your energy is better. Speak with your caregiver about any limitations on activity for underlying diseases you may have. SEEK MEDICAL CARE IF:   Your condition is not improving after your transfusion.  You develop redness or irritation at the intravenous (IV) site. SEEK IMMEDIATE MEDICAL CARE IF:  Any of the following symptoms occur over the next 12 hours:  Shaking chills.  You have a temperature by mouth above 102 F (38.9 C), not controlled by medicine.  Chest, back, or muscle pain.  People around you feel you are not acting correctly or are confused.  Shortness of breath or difficulty breathing.  Dizziness and fainting.  You get a rash or develop hives.  You have a decrease in urine output.  Your urine turns a dark color or changes to pink, red, or brown. Any of the following symptoms occur over the next 10 days:  You have a temperature by mouth above 102 F (38.9 C), not controlled by medicine.  Shortness of breath.  Weakness after normal activity.  The white part of the eye turns yellow (jaundice).  You have a decrease in the amount of urine or are urinating less often.  Your urine turns a dark color or changes to pink, red, or brown. Document Released: 07/15/2000 Document Revised: 10/10/2011 Document Reviewed: 03/03/2008 [facility] Patient Information 2014 [place], [place].  _______________________________________________________________________

## 2017-11-27 ENCOUNTER — Encounter (HOSPITAL_COMMUNITY): Payer: Self-pay

## 2017-11-27 ENCOUNTER — Other Ambulatory Visit: Payer: Self-pay

## 2017-11-27 ENCOUNTER — Encounter: Payer: BLUE CROSS/BLUE SHIELD | Attending: General Surgery | Admitting: Skilled Nursing Facility1

## 2017-11-27 ENCOUNTER — Encounter (HOSPITAL_COMMUNITY)
Admission: RE | Admit: 2017-11-27 | Discharge: 2017-11-27 | Disposition: A | Discharge status: Home / Self Care | Payer: BLUE CROSS/BLUE SHIELD | Source: Ambulatory Visit | Attending: General Surgery | Admitting: General Surgery

## 2017-11-27 ENCOUNTER — Encounter (INDEPENDENT_AMBULATORY_CARE_PROVIDER_SITE_OTHER): Payer: Self-pay

## 2017-11-27 DIAGNOSIS — Z6841 Body Mass Index (BMI) 40.0 and over, adult: Secondary | ICD-10-CM | POA: Diagnosis not present

## 2017-11-27 DIAGNOSIS — Z713 Dietary counseling and surveillance: Secondary | ICD-10-CM | POA: Insufficient documentation

## 2017-11-27 DIAGNOSIS — Z01812 Encounter for preprocedural laboratory examination: Secondary | ICD-10-CM | POA: Insufficient documentation

## 2017-11-27 DIAGNOSIS — K8020 Calculus of gallbladder without cholecystitis without obstruction: Secondary | ICD-10-CM | POA: Insufficient documentation

## 2017-11-27 DIAGNOSIS — E669 Obesity, unspecified: Secondary | ICD-10-CM

## 2017-11-27 LAB — CBC WITH DIFFERENTIAL/PLATELET
Basophils Absolute: 0 10*3/uL (ref 0.0–0.1)
Basophils Relative: 0 %
EOS ABS: 0.2 10*3/uL (ref 0.0–0.7)
Eosinophils Relative: 2 %
HCT: 39.2 % (ref 36.0–46.0)
HEMOGLOBIN: 13 g/dL (ref 12.0–15.0)
LYMPHS ABS: 2.2 10*3/uL (ref 0.7–4.0)
LYMPHS PCT: 24 %
MCH: 30.9 pg (ref 26.0–34.0)
MCHC: 33.2 g/dL (ref 30.0–36.0)
MCV: 93.1 fL (ref 78.0–100.0)
Monocytes Absolute: 0.5 10*3/uL (ref 0.1–1.0)
Monocytes Relative: 6 %
NEUTROS PCT: 68 %
Neutro Abs: 6.2 10*3/uL (ref 1.7–7.7)
Platelets: 264 10*3/uL (ref 150–400)
RBC: 4.21 MIL/uL (ref 3.87–5.11)
RDW: 14.2 % (ref 11.5–15.5)
WBC: 9.1 10*3/uL (ref 4.0–10.5)

## 2017-11-27 LAB — COMPREHENSIVE METABOLIC PANEL
ALK PHOS: 70 U/L (ref 38–126)
ALT: 12 U/L — AB (ref 14–54)
AST: 17 U/L (ref 15–41)
Albumin: 3.6 g/dL (ref 3.5–5.0)
Anion gap: 8 (ref 5–15)
BUN: 16 mg/dL (ref 6–20)
CALCIUM: 8.7 mg/dL — AB (ref 8.9–10.3)
CO2: 23 mmol/L (ref 22–32)
CREATININE: 0.63 mg/dL (ref 0.44–1.00)
Chloride: 108 mmol/L (ref 101–111)
GFR calc non Af Amer: >60 mL/min (ref >60)
GLUCOSE: 83 mg/dL (ref 65–99)
Potassium: 4.3 mmol/L (ref 3.5–5.1)
SODIUM: 139 mmol/L (ref 135–145)
Total Bilirubin: 0.7 mg/dL (ref 0.3–1.2)
Total Protein: 7.2 g/dL (ref 6.5–8.1)

## 2017-11-27 LAB — ABO/RH: ABO/RH(D): O POS

## 2017-11-29 ENCOUNTER — Encounter: Payer: Self-pay | Admitting: Skilled Nursing Facility1

## 2017-11-29 NOTE — Progress Notes (Signed)
Pre-Operative Nutrition Class:  Appt start time: 5859   End time:  1830.  Patient was seen on 11/27/2017 for Pre-Operative Bariatric Surgery [facility]   Surgery date:  Surgery type: Band to sleeve [facility] at [facility]: 202 Weight today: 210  Samples given per MNT protocol. Patient educated on appropriate usage: Procare Multivitamin Lot #  [id number] Exp: 01/2019  Renee Pain Protein  Shake Lot # [id number] Exp: 07/march/2020  The following the learning objectives were met by the patient during this course:  Identify Pre-Op Dietary Goals and will begin 2 weeks pre-operatively  Identify appropriate sources of fluids and proteins   State protein recommendations and appropriate sources pre and post-operatively  Identify Post-Operative Dietary Goals and will follow for 2 weeks post-operatively  Identify appropriate multivitamin and calcium sources  Describe the need for physical activity post-operatively and will follow MD recommendations  State when to call healthcare provider regarding medication questions or post-operative complications  Handouts given during class include:  Pre-Op Bariatric Surgery Diet Handout  Protein Shake Handout  Post-Op Bariatric Surgery Nutrition Handout  BELT Program Information Flyer  Support Group Information Flyer  [facility] Bariatric Supplements Price List  Follow-Up Plan: Patient will follow-up at [facility] 2 weeks post operatively for diet advancement per MD.

## 2017-12-04 ENCOUNTER — Encounter (HOSPITAL_COMMUNITY)
Admission: RE | Disposition: A | Discharge status: Home / Self Care | Payer: Self-pay | Source: Ambulatory Visit | Attending: General Surgery

## 2017-12-04 ENCOUNTER — Inpatient Hospital Stay (HOSPITAL_COMMUNITY): Payer: BLUE CROSS/BLUE SHIELD

## 2017-12-04 ENCOUNTER — Other Ambulatory Visit: Payer: Self-pay

## 2017-12-04 ENCOUNTER — Encounter (HOSPITAL_COMMUNITY): Payer: Self-pay | Admitting: *Deleted

## 2017-12-04 ENCOUNTER — Inpatient Hospital Stay (HOSPITAL_COMMUNITY): Payer: BLUE CROSS/BLUE SHIELD | Admitting: Anesthesiology

## 2017-12-04 ENCOUNTER — Inpatient Hospital Stay (HOSPITAL_COMMUNITY)
Admission: RE | Admit: 2017-12-04 | Discharge: 2017-12-06 | DRG: 327 | Disposition: A | Discharge status: Home / Self Care | Payer: BLUE CROSS/BLUE SHIELD | Source: Ambulatory Visit | Attending: General Surgery | Admitting: General Surgery

## 2017-12-04 DIAGNOSIS — Y831 Surgical operation with implant of artificial internal device as the cause of abnormal reaction of the patient, or of later complication, without mention of misadventure at the time of the procedure: Secondary | ICD-10-CM | POA: Diagnosis present

## 2017-12-04 DIAGNOSIS — K8020 Calculus of gallbladder without cholecystitis without obstruction: Secondary | ICD-10-CM | POA: Diagnosis present

## 2017-12-04 DIAGNOSIS — Z6841 Body Mass Index (BMI) 40.0 and over, adult: Secondary | ICD-10-CM

## 2017-12-04 DIAGNOSIS — K9509 Other complications of gastric band procedure: Secondary | ICD-10-CM | POA: Diagnosis present

## 2017-12-04 HISTORY — PX: LAPAROSCOPIC GASTRIC BAND REMOVAL WITH LAPAROSCOPIC GASTRIC SLEEVE RESECTION: SHX6498

## 2017-12-04 HISTORY — PX: CHOLECYSTECTOMY: SHX55

## 2017-12-04 LAB — HEMOGLOBIN AND HEMATOCRIT, BLOOD
HEMATOCRIT: 38.3 % (ref 36.0–46.0)
HEMOGLOBIN: 12.6 g/dL (ref 12.0–15.0)

## 2017-12-04 LAB — TYPE AND SCREEN
ABO/RH(D): O POS
ANTIBODY SCREEN: NEGATIVE

## 2017-12-04 LAB — PREGNANCY, URINE: PREG TEST UR: NEGATIVE

## 2017-12-04 SURGERY — LAPAROSCOPIC GASTRIC BAND REMOVAL WITH LAPAROSCOPIC GASTRIC SLEEVE RESECTION
Anesthesia: General | Site: Abdomen

## 2017-12-04 MED ORDER — ONDANSETRON HCL 4 MG/2ML IJ SOLN
INTRAMUSCULAR | Status: DC | PRN
  Administered 2017-12-04: 4 mg via INTRAVENOUS

## 2017-12-04 MED ORDER — LIDOCAINE 2% (20 MG/ML) 5 ML SYRINGE
INTRAMUSCULAR | Status: AC
  Filled 2017-12-04: qty 5

## 2017-12-04 MED ORDER — SCOPOLAMINE 1 MG/3DAYS TD PT72
1.0000 | MEDICATED_PATCH | TRANSDERMAL | Status: DC
  Administered 2017-12-04: 1.5 mg via TRANSDERMAL
  Filled 2017-12-04: qty 1

## 2017-12-04 MED ORDER — FENTANYL CITRATE (PF) 100 MCG/2ML IJ SOLN
INTRAMUSCULAR | Status: AC
  Filled 2017-12-04: qty 2

## 2017-12-04 MED ORDER — OXYCODONE HCL 5 MG/5ML PO SOLN
5.0000 mg | ORAL | Status: DC | PRN
  Administered 2017-12-05 (×2): 5 mg via ORAL
  Administered 2017-12-05: 10 mg via ORAL
  Administered 2017-12-06: 5 mg via ORAL
  Filled 2017-12-04: qty 5
  Filled 2017-12-04 (×2): qty 10
  Filled 2017-12-04: qty 5

## 2017-12-04 MED ORDER — EPHEDRINE 5 MG/ML INJ
INTRAVENOUS | Status: AC
  Filled 2017-12-04: qty 10

## 2017-12-04 MED ORDER — BUPIVACAINE HCL (PF) 0.25 % IJ SOLN
INTRAMUSCULAR | Status: DC | PRN
  Administered 2017-12-04: 30 mL

## 2017-12-04 MED ORDER — HEPARIN SODIUM (PORCINE) 5000 UNIT/ML IJ SOLN
5000.0000 [IU] | INTRAMUSCULAR | Status: AC
  Administered 2017-12-04: 5000 [IU] via SUBCUTANEOUS
  Filled 2017-12-04: qty 1

## 2017-12-04 MED ORDER — BUPIVACAINE HCL (PF) 0.25 % IJ SOLN
INTRAMUSCULAR | Status: AC
  Filled 2017-12-04: qty 30

## 2017-12-04 MED ORDER — LIDOCAINE 20MG/ML (2%) 15 ML SYRINGE OPTIME
INTRAMUSCULAR | Status: DC | PRN
  Administered 2017-12-04: 1 mg/kg/h via INTRAVENOUS

## 2017-12-04 MED ORDER — SODIUM CHLORIDE 0.9 % IJ SOLN
INTRAMUSCULAR | Status: AC
  Filled 2017-12-04: qty 50

## 2017-12-04 MED ORDER — DEXAMETHASONE SODIUM PHOSPHATE 4 MG/ML IJ SOLN: 4.0000 mg | INTRAMUSCULAR | Status: DC

## 2017-12-04 MED ORDER — KETAMINE HCL 10 MG/ML IJ SOLN
INTRAMUSCULAR | Status: DC | PRN
  Administered 2017-12-04: 10 mg via INTRAVENOUS
  Administered 2017-12-04: 20 mg via INTRAVENOUS
  Administered 2017-12-04: 10 mg via INTRAVENOUS

## 2017-12-04 MED ORDER — PROPOFOL 10 MG/ML IV BOLUS
INTRAVENOUS | Status: DC | PRN
  Administered 2017-12-04: 200 mg via INTRAVENOUS

## 2017-12-04 MED ORDER — SUGAMMADEX SODIUM 200 MG/2ML IV SOLN
INTRAVENOUS | Status: AC
  Filled 2017-12-04: qty 2

## 2017-12-04 MED ORDER — ONDANSETRON HCL 4 MG/2ML IJ SOLN
4.0000 mg | INTRAMUSCULAR | Status: DC | PRN
  Administered 2017-12-05 (×2): 4 mg via INTRAVENOUS
  Filled 2017-12-04 (×2): qty 2

## 2017-12-04 MED ORDER — OXYCODONE HCL 5 MG PO TABS: 5.0000 mg | ORAL_TABLET | Freq: Once | ORAL | Status: DC | PRN

## 2017-12-04 MED ORDER — STERILE WATER FOR IRRIGATION IR SOLN
Status: DC | PRN
  Administered 2017-12-04: 1000 mL

## 2017-12-04 MED ORDER — ACETAMINOPHEN 160 MG/5ML PO SOLN
650.0000 mg | Freq: Four times a day (QID) | ORAL | Status: DC
  Administered 2017-12-04 – 2017-12-06 (×5): 650 mg via ORAL
  Filled 2017-12-04 (×5): qty 20.3

## 2017-12-04 MED ORDER — ROCURONIUM BROMIDE 10 MG/ML (PF) SYRINGE
PREFILLED_SYRINGE | INTRAVENOUS | Status: AC
  Filled 2017-12-04: qty 5

## 2017-12-04 MED ORDER — SUGAMMADEX SODIUM 200 MG/2ML IV SOLN
INTRAVENOUS | Status: DC | PRN
  Administered 2017-12-04: 200 mg via INTRAVENOUS

## 2017-12-04 MED ORDER — PROPOFOL 10 MG/ML IV BOLUS
INTRAVENOUS | Status: AC
  Filled 2017-12-04: qty 20

## 2017-12-04 MED ORDER — POTASSIUM CHLORIDE IN NACL 20-0.9 MEQ/L-% IV SOLN
INTRAVENOUS | Status: DC
  Administered 2017-12-04 – 2017-12-05 (×4): via INTRAVENOUS
  Filled 2017-12-04 (×4): qty 1000

## 2017-12-04 MED ORDER — FAMOTIDINE IN NACL 20-0.9 MG/50ML-% IV SOLN
20.0000 mg | Freq: Two times a day (BID) | INTRAVENOUS | Status: DC
  Administered 2017-12-04 – 2017-12-06 (×4): 20 mg via INTRAVENOUS
  Filled 2017-12-04 (×3): qty 50

## 2017-12-04 MED ORDER — ONDANSETRON HCL 4 MG/2ML IJ SOLN
INTRAMUSCULAR | Status: AC
  Filled 2017-12-04: qty 2

## 2017-12-04 MED ORDER — CELECOXIB 200 MG PO CAPS
400.0000 mg | ORAL_CAPSULE | ORAL | Status: AC
  Administered 2017-12-04: 400 mg via ORAL
  Filled 2017-12-04: qty 2

## 2017-12-04 MED ORDER — ONDANSETRON HCL 4 MG/2ML IJ SOLN: 4.0000 mg | Freq: Once | INTRAMUSCULAR | Status: DC | PRN

## 2017-12-04 MED ORDER — FENTANYL CITRATE (PF) 100 MCG/2ML IJ SOLN
25.0000 ug | INTRAMUSCULAR | Status: DC | PRN
  Administered 2017-12-04 (×4): 25 ug via INTRAVENOUS

## 2017-12-04 MED ORDER — CEFOTETAN DISODIUM-DEXTROSE 2-2.08 GM-%(50ML) IV SOLR
2.0000 g | INTRAVENOUS | Status: AC
  Administered 2017-12-04: 2 g via INTRAVENOUS
  Filled 2017-12-04: qty 50

## 2017-12-04 MED ORDER — IOPAMIDOL (ISOVUE-300) INJECTION 61%
INTRAVENOUS | Status: DC | PRN
  Administered 2017-12-04: 6.5 mL

## 2017-12-04 MED ORDER — ENOXAPARIN SODIUM 30 MG/0.3ML ~~LOC~~ SOLN
30.0000 mg | Freq: Two times a day (BID) | SUBCUTANEOUS | Status: DC
  Administered 2017-12-05 – 2017-12-06 (×3): 30 mg via SUBCUTANEOUS
  Filled 2017-12-04 (×3): qty 0.3

## 2017-12-04 MED ORDER — GABAPENTIN 100 MG PO CAPS
200.0000 mg | ORAL_CAPSULE | Freq: Two times a day (BID) | ORAL | Status: DC
  Administered 2017-12-05 – 2017-12-06 (×3): 200 mg via ORAL
  Filled 2017-12-04 (×3): qty 2

## 2017-12-04 MED ORDER — SODIUM CHLORIDE 0.9 % IJ SOLN
INTRAMUSCULAR | Status: DC | PRN
  Administered 2017-12-04: 50 mL

## 2017-12-04 MED ORDER — PREMIER PROTEIN SHAKE
2.0000 [oz_av] | ORAL | Status: DC
  Administered 2017-12-05 – 2017-12-06 (×4): 2 [oz_av] via ORAL

## 2017-12-04 MED ORDER — DEXAMETHASONE SODIUM PHOSPHATE 10 MG/ML IJ SOLN
INTRAMUSCULAR | Status: AC
  Filled 2017-12-04: qty 1

## 2017-12-04 MED ORDER — MEPERIDINE HCL 50 MG/ML IJ SOLN: 6.2500 mg | INTRAMUSCULAR | Status: DC | PRN

## 2017-12-04 MED ORDER — LIDOCAINE 2% (20 MG/ML) 5 ML SYRINGE
INTRAMUSCULAR | Status: DC | PRN
  Administered 2017-12-04: 100 mg via INTRAVENOUS

## 2017-12-04 MED ORDER — GABAPENTIN 300 MG PO CAPS
300.0000 mg | ORAL_CAPSULE | ORAL | Status: AC
  Administered 2017-12-04: 300 mg via ORAL
  Filled 2017-12-04: qty 1

## 2017-12-04 MED ORDER — FENTANYL CITRATE (PF) 100 MCG/2ML IJ SOLN
INTRAMUSCULAR | Status: DC | PRN
  Administered 2017-12-04: 50 ug via INTRAVENOUS
  Administered 2017-12-04: 100 ug via INTRAVENOUS
  Administered 2017-12-04: 50 ug via INTRAVENOUS

## 2017-12-04 MED ORDER — OXYCODONE HCL 5 MG/5ML PO SOLN
5.0000 mg | Freq: Once | ORAL | Status: DC | PRN
  Filled 2017-12-04: qty 5

## 2017-12-04 MED ORDER — PHENYLEPHRINE 40 MCG/ML (10ML) SYRINGE FOR IV PUSH (FOR BLOOD PRESSURE SUPPORT)
PREFILLED_SYRINGE | INTRAVENOUS | Status: DC | PRN
  Administered 2017-12-04 (×2): 80 ug via INTRAVENOUS

## 2017-12-04 MED ORDER — LACTATED RINGERS IR SOLN
Status: DC | PRN
  Administered 2017-12-04: 1000 mL

## 2017-12-04 MED ORDER — MIDAZOLAM HCL 5 MG/5ML IJ SOLN
INTRAMUSCULAR | Status: DC | PRN
  Administered 2017-12-04: 2 mg via INTRAVENOUS

## 2017-12-04 MED ORDER — BUPIVACAINE LIPOSOME 1.3 % IJ SUSP
20.0000 mL | Freq: Once | INTRAMUSCULAR | Status: AC
  Administered 2017-12-04: 20 mL
  Filled 2017-12-04: qty 20

## 2017-12-04 MED ORDER — CHLORHEXIDINE GLUCONATE CLOTH 2 % EX PADS: 6.0000 | MEDICATED_PAD | Freq: Once | CUTANEOUS | Status: DC

## 2017-12-04 MED ORDER — SUCCINYLCHOLINE CHLORIDE 200 MG/10ML IV SOSY
PREFILLED_SYRINGE | INTRAVENOUS | Status: AC
  Filled 2017-12-04: qty 10

## 2017-12-04 MED ORDER — IOPAMIDOL (ISOVUE-300) INJECTION 61%
INTRAVENOUS | Status: AC
  Filled 2017-12-04: qty 50

## 2017-12-04 MED ORDER — 0.9 % SODIUM CHLORIDE (POUR BTL) OPTIME
TOPICAL | Status: DC | PRN
  Administered 2017-12-04: 1000 mL

## 2017-12-04 MED ORDER — ACETAMINOPHEN 325 MG PO TABS: 325.0000 mg | ORAL_TABLET | ORAL | Status: DC | PRN

## 2017-12-04 MED ORDER — EVICEL 5 ML EX KIT
PACK | Freq: Once | CUTANEOUS | Status: AC
  Administered 2017-12-04: 5 mL
  Filled 2017-12-04: qty 1

## 2017-12-04 MED ORDER — MIDAZOLAM HCL 2 MG/2ML IJ SOLN
INTRAMUSCULAR | Status: AC
  Filled 2017-12-04: qty 2

## 2017-12-04 MED ORDER — ACETAMINOPHEN 160 MG/5ML PO SOLN: 325.0000 mg | ORAL | Status: DC | PRN

## 2017-12-04 MED ORDER — SUCCINYLCHOLINE CHLORIDE 200 MG/10ML IV SOSY
PREFILLED_SYRINGE | INTRAVENOUS | Status: DC | PRN
  Administered 2017-12-04: 120 mg via INTRAVENOUS

## 2017-12-04 MED ORDER — EPHEDRINE SULFATE-NACL 50-0.9 MG/10ML-% IV SOSY
PREFILLED_SYRINGE | INTRAVENOUS | Status: DC | PRN
  Administered 2017-12-04: 5 mg via INTRAVENOUS
  Administered 2017-12-04: 10 mg via INTRAVENOUS
  Administered 2017-12-04: 15 mg via INTRAVENOUS

## 2017-12-04 MED ORDER — LACTATED RINGERS IV SOLN
INTRAVENOUS | Status: DC
  Administered 2017-12-04 (×2): via INTRAVENOUS

## 2017-12-04 MED ORDER — APREPITANT 40 MG PO CAPS
40.0000 mg | ORAL_CAPSULE | ORAL | Status: AC
  Administered 2017-12-04: 40 mg via ORAL
  Filled 2017-12-04: qty 1

## 2017-12-04 MED ORDER — DEXAMETHASONE SODIUM PHOSPHATE 10 MG/ML IJ SOLN
INTRAMUSCULAR | Status: DC | PRN
  Administered 2017-12-04: 10 mg via INTRAVENOUS

## 2017-12-04 MED ORDER — ACETAMINOPHEN 500 MG PO TABS
1000.0000 mg | ORAL_TABLET | ORAL | Status: AC
  Administered 2017-12-04: 1000 mg via ORAL
  Filled 2017-12-04: qty 2

## 2017-12-04 MED ORDER — ROCURONIUM BROMIDE 10 MG/ML (PF) SYRINGE
PREFILLED_SYRINGE | INTRAVENOUS | Status: DC | PRN
  Administered 2017-12-04: 2 mg via INTRAVENOUS
  Administered 2017-12-04: 20 mg via INTRAVENOUS
  Administered 2017-12-04: 10 mg via INTRAVENOUS
  Administered 2017-12-04: 48 mg via INTRAVENOUS

## 2017-12-04 MED ORDER — MORPHINE SULFATE (PF) 2 MG/ML IV SOLN: 1.0000 mg | INTRAVENOUS | Status: DC | PRN

## 2017-12-04 SURGICAL SUPPLY — 70 items
ADH SKN CLS APL DERMABOND .7 (GAUZE/BANDAGES/DRESSINGS) ×1
APPLIER CLIP ROT 10 11.4 M/L (STAPLE) ×2
APPLIER CLIP ROT 13.4 12 LRG (CLIP)
APR CLP LRG 13.4X12 ROT 20 MLT (CLIP)
APR CLP MED LRG 11.4X10 (STAPLE) ×1
BAG SPEC RTRVL 10 TROC 200 (ENDOMECHANICALS) ×1
BAG SPEC RTRVL LRG 6X4 10 (ENDOMECHANICALS)
BLADE SURG SZ11 CARB STEEL (BLADE) ×2 IMPLANT
CABLE HIGH FREQUENCY MONO STRZ (ELECTRODE) ×2 IMPLANT
CATH REDDICK CHOLANGI 4FR 50CM (CATHETERS) IMPLANT
CHLORAPREP W/TINT 26ML (MISCELLANEOUS) ×2 IMPLANT
CLIP APPLIE ROT 10 11.4 M/L (STAPLE) ×1 IMPLANT
CLIP APPLIE ROT 13.4 12 LRG (CLIP) IMPLANT
COVER MAYO STAND STRL (DRAPES) ×2 IMPLANT
COVER SURGICAL LIGHT HANDLE (MISCELLANEOUS) ×2 IMPLANT
DECANTER SPIKE VIAL GLASS SM (MISCELLANEOUS) ×2 IMPLANT
DERMABOND ADVANCED (GAUZE/BANDAGES/DRESSINGS) ×1
DERMABOND ADVANCED .7 DNX12 (GAUZE/BANDAGES/DRESSINGS) ×1 IMPLANT
DEVICE SUTURE ENDOST 10MM (ENDOMECHANICALS) IMPLANT
DRAPE C-ARM 42X120 X-RAY (DRAPES) ×2 IMPLANT
DRAPE UTILITY XL STRL (DRAPES) ×2 IMPLANT
ELECT PENCIL ROCKER SW 15FT (MISCELLANEOUS) ×2 IMPLANT
ELECT REM PT RETURN 15FT ADLT (MISCELLANEOUS) ×2 IMPLANT
GAUZE SPONGE 4X4 12PLY STRL (GAUZE/BANDAGES/DRESSINGS) IMPLANT
GLOVE BIOGEL PI IND STRL 7.5 (GLOVE) ×1 IMPLANT
GLOVE BIOGEL PI INDICATOR 7.5 (GLOVE) ×1
GLOVE ECLIPSE 7.5 STRL STRAW (GLOVE) ×2 IMPLANT
GOWN STRL REUS W/TWL XL LVL3 (GOWN DISPOSABLE) ×10 IMPLANT
HEMOSTAT SNOW SURGICEL 2X4 (HEMOSTASIS) IMPLANT
HEMOSTAT SURGICEL 4X8 (HEMOSTASIS) IMPLANT
HOVERMATT SINGLE USE (MISCELLANEOUS) ×2 IMPLANT
KIT BASIN OR (CUSTOM PROCEDURE TRAY) ×2 IMPLANT
NEEDLE SPNL 22GX3.5 QUINCKE BK (NEEDLE) ×2 IMPLANT
PACK UNIVERSAL I (CUSTOM PROCEDURE TRAY) ×2 IMPLANT
PENCIL HANDSWITCHING (ELECTRODE) ×4 IMPLANT
POUCH RETRIEVAL ECOSAC 10 (ENDOMECHANICALS) ×1 IMPLANT
POUCH RETRIEVAL ECOSAC 10MM (ENDOMECHANICALS) ×1
POUCH SPECIMEN RETRIEVAL 10MM (ENDOMECHANICALS) IMPLANT
RELOAD STAPLER BLUE 60MM (STAPLE) ×3 IMPLANT
RELOAD STAPLER GOLD 60MM (STAPLE) ×3 IMPLANT
RELOAD STAPLER GREEN 60MM (STAPLE) ×1 IMPLANT
SCISSORS LAP 5X35 DISP (ENDOMECHANICALS) ×2 IMPLANT
SET CHOLANGIOGRAPH MIX (MISCELLANEOUS) ×2 IMPLANT
SET IRRIG TUBING LAPAROSCOPIC (IRRIGATION / IRRIGATOR) ×2 IMPLANT
SHEARS HARMONIC ACE PLUS 45CM (MISCELLANEOUS) ×2 IMPLANT
SLEEVE ADV FIXATION 5X100MM (TROCAR) ×2 IMPLANT
SLEEVE GASTRECTOMY 36FR VISIGI (MISCELLANEOUS) ×2 IMPLANT
SLEEVE XCEL OPT CAN 5 100 (ENDOMECHANICALS) ×2 IMPLANT
STAPLER RELOAD BLUE 60MM (STAPLE) ×6
STAPLER RELOAD GOLD 60MM (STAPLE) ×6
STAPLER RELOAD GREEN 60MM (STAPLE) ×2
SUT ETHILON 2 0 PS N (SUTURE) IMPLANT
SUT MNCRL AB 4-0 PS2 18 (SUTURE) ×2 IMPLANT
SUT VIC AB 0 BRD 54 (SUTURE) ×2 IMPLANT
SYR 20CC LL (SYRINGE) ×2 IMPLANT
SYSTEM WECK SHIELD CLOSURE (TROCAR) ×2 IMPLANT
TIP RIGID 35CM EVICEL (HEMOSTASIS) ×2 IMPLANT
TOWEL OR 17X26 10 PK STRL BLUE (TOWEL DISPOSABLE) ×2 IMPLANT
TOWEL OR NON WOVEN STRL DISP B (DISPOSABLE) ×2 IMPLANT
TRAY FOLEY CATH 14FRSI W/METER (CATHETERS) ×2 IMPLANT
TRAY LAP CHOLE (CUSTOM PROCEDURE TRAY) ×2 IMPLANT
TRAY LAPAROSCOPIC (CUSTOM PROCEDURE TRAY) ×2 IMPLANT
TROCAR ADV FIXATION 5X100MM (TROCAR) ×2 IMPLANT
TROCAR BLADELESS 15MM (ENDOMECHANICALS) ×2 IMPLANT
TROCAR BLADELESS OPT 5 100 (ENDOMECHANICALS) ×2 IMPLANT
TROCAR XCEL BLUNT TIP 100MML (ENDOMECHANICALS) ×2 IMPLANT
TROCAR XCEL NON-BLD 11X100MML (ENDOMECHANICALS) ×2 IMPLANT
TUBING CONNECTING 10 (TUBING) ×4 IMPLANT
TUBING ENDO SMARTCAP PENTAX (MISCELLANEOUS) ×2 IMPLANT
TUBING INSUF HEATED (TUBING) ×2 IMPLANT

## 2017-12-04 NOTE — H&P (Signed)
History of Present Illness Tiffany Salina T. [name] MD; 11/29/2017 11:43 AM) The patient is a 47 year old female who presents for evaluation of gall stones. Patient presents for a preoperative visit prior to planned conversion from lap band to sleeve gastrectomy and concomitant cholecystectomy. Her original presentation was as follows:  The patient is a 47 year old female who presents with obesity. She presents for for consideration for surgical treatment for morbid obesity. The patient gives a history of progressive obesity since her first pregnancy at age 21 despite multiple attempts at medical management. Subsequently in [place] in 2012 she had placement of a lap band. The surgery was uncomplicated. She thinks she had 3 or 4 follow-up visits for fills which would produce some temporary restriction but then she would be able to eat what she wanted. Her last follow-up there was in 2014. Her surgeon felt that she had inadequate weight loss at that time. Her preoperative weight was 235 prior to band and is currently 218. She states the port bothers her with pain in her abdominal wall. Obesity has been affecting the patient in a number of ways including difficulty with daily activities and enjoying time with her family. 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 She is concerned about her long-term health.. She has thus far avoided any serious comorbidities though she does have chronic joint pain and reported early arthritis. The patient has been to our initial information seminar, researched surgical options thoroughly, and is interested in revisional surgery and conversion to sleeve gastrectomy.  As part of the evaluation for her ongoing abdominal pain and gallbladder ultrasound was recently obtained showing gallstones with no complicating factors. The patient states that she continues to have pain as she did when I first saw her. She localizes the pain to her low epigastrium which is  her port site. She says is related mainly to motion and bending and last 15-20 minutes. No relation to eating. She gets occasional nausea but this is not at the same time as the pain.  She has successfully completed her preoperative workup. No concerns on psychologic or nutrition evaluation. Upper GI series showed no complication from her lap band which appeared to be in good position. She has some ongoing nausea which she attributes to her gallstones. She has pain around her port but not other abdominal pain. Preoperative lab work was unremarkable. She has not had any other health changes since her original evaluation.     Problem List/Past Medical Tiffany Salina T. Maryori Weide, MD; 11/29/2017 11:43 AM) GALLSTONES (K80.20)  OTHER COMPLICATIONS OF GASTRIC BAND PROCEDURE (K95.09)  MORBID OBESITY, UNSPECIFIED OBESITY TYPE (E66.01)   Past Surgical History Tiffany Salina T. Chandy Tarman, MD; 11/29/2017 11:43 AM) Lap Band   Diagnostic Studies History Tiffany Salina T. Candelaria Pies, MD; 11/29/2017 11:43 AM) Colonoscopy  never Mammogram  within last year Pap Smear  1-5 years ago  Allergies Sander Nephew, CMA; 11/29/2017 11:12 AM) No Known Drug Allergies [11/29/2017]: Allergies Reconciled   Medication History Sander Nephew, CMA; 11/29/2017 11:13 AM) Naproxen (  Tablet, Oral) Active. Methocarbamol (  Tablet, Oral) Active. Medications Reconciled  Social History Tiffany Salina T. Jeshurun Oaxaca, MD; 11/29/2017 11:43 AM) Caffeine use  Coffee. No alcohol use  No drug use  Tobacco use  Never smoker.  Family History Tiffany Salina T. Zaire Levesque, MD; 11/29/2017 11:43 AM) Alcohol Abuse  Father. Arthritis  Father, Mother, Sister, Son. Diabetes Mellitus  Father. Heart Disease  Father, Mother, Sister. Heart disease in female family member before age 37  Heart disease  in female family member before age 69  Hypertension  Brother, Father, Mother, Sister. Kidney Disease  Sister. Respiratory Condition   Sister.  Pregnancy / Birth History Tiffany Salina T. Kashawn Dirr, MD; 11/29/2017 11:43 AM) Age at menarche  9 years. Gravida  4 Irregular periods  Maternal age  58-20 Para  3  Other Problems Tiffany Salina T. Derran Sear, MD; 11/29/2017 11:43 AM) Arthritis   Vitals (Danielle Gerrigner CMA; 11/29/2017 11:13 AM) 11/29/2017 11:13 AM Weight: 207.38 lb Height: 59in Body Surface Area: 1.87 m Body Mass Index: 41.88 kg/m  Temp.: 98.22F(Oral)  Pulse: 95 (Regular)  BP: 126/90 (Sitting, Left Arm, Large)       Physical Exam Tiffany Salina T. Flynn Lininger MD; 11/29/2017 11:45 AM) The physical exam findings are as follows: Note:General: Alert, morbidly obese Afro-American female, in no distress Skin: Warm and dry without rash or infection. HEENT: No palpable masses or thyromegaly. Sclera nonicteric. Pupils equal round and reactive. Lymph nodes: No cervical, supraclavicular, or inguinal nodes palpable. Lungs: Breath sounds clear and equal. No wheezing or increased work of breathing. Cardiovascular: Regular rate and rhythm without murmer. No JVD or edema. Abdomen: Nondistended. Soft and nontender. No masses palpable. No organomegaly. No palpable hernias. Port palpable just above the umbilicus and slightly tender. Extremities: No edema or joint swelling or deformity. No chronic venous stasis changes. Neurologic: Alert and fully oriented. Gait normal. No focal weakness. Psychiatric: Normal mood and affect. Thought content appropriate with normal judgement and insight    Assessment & Plan Tiffany Salina T. Bibi Economos MD; 11/29/2017 11:46 AM) MORBID OBESITY, UNSPECIFIED OBESITY TYPE (E66.01) OTHER COMPLICATIONS OF GASTRIC BAND PROCEDURE (K95.09) Impression: Patient with progressive morbid obesity unresponsive to multiple efforts at medical management who presents with a BMI of 44 and comorbidities of joint pain. I believe there would be very significant medical benefit from surgical weight loss. She has had a lap  band with inadequate weight loss. I had discussed with her that I would certainly be willing to try to more aggressively fill her band to see if we can get further weight loss with a lap band. However due to the discomfort she has and with her previous experience with fills she does not want to pursue this route. After our discussion of surgical options currently available the patient had previously decided to proceed with laparoscopic sleeve gastrectomy. One of her main reasons to want to convert from the band is pain around the port site. It is little difficult to tell if this is port pain or secondary to cholelithiasis but I suspect the former. The ideal treatment would be to remove her band with conversion to sleeve gastrectomy and cholecystectomy simultaneously. She has been approved for this procedure and successfully completed her workup. We reviewed her studies. We reviewed the procedure again and discussed complications and she reviewed the consent form. She is given prescription for pain and nausea medication and will be started on Protonix perioperatively. All her questions were answered.

## 2017-12-04 NOTE — Op Note (Signed)
Preoperative Diagnosis: MORBID OBESITY  Postoprative Diagnosis: MORBID OBESITY  Procedure: Procedure(s): LAPAROSCOPIC GASTRIC BAND AND COMPONENTS REMOVAL WITH CONVERSION TO GASTRIC SLEEVE RESECTION AND UPPER ENDOSCOPY LAPAROSCOPIC CHOLECYSTECTOMY WITH INTRAOPERATIVE CHOLANGIOGRAM   Surgeon: Glenna Fellows T   Assistants: Luretha Murphy  Anesthesia:  General endotracheal anesthesia  Indications: Patient is a 47 year old female with remote history of lap band placement in [place] approximately 2012.  She presents with a constellation of worsening epigastric pain which she locates specifically to her port which is placed just above her umbilicus.  She also has nausea.  In addition she has failure of weight loss with the band despite numerous adjustments and effort.  Work-up includes abdominal ultrasound that shows cholelithiasis.  With this constellation of problems after extensive preoperative work-up and discussion detailed elsewhere we have elected to proceed with laparoscopic removal of her lap band and components with conversion to sleeve gastrectomy as well as cholecystectomy with cholangiogram.    Procedure Detail: Patient was brought to the operating room, placed in the supine position on the operating table, and general endotracheal anesthesia induced.  PAS were in place.  She received preoperative IV antibiotics.  The abdomen was widely sterilely prepped and draped.  Foley catheter had been placed.  Patient timeout was performed and correct procedure verified.  Access was obtained with a 5 mm Optiview trocar in the left upper quadrant without difficulty and pneumoperitoneum established.  There was no evidence of trocar injury.  Under direct vision a 5 mm trocar was placed laterally in the right upper quadrant, a 15 mm trocar in the upper right abdomen near the midline which was at the site of the previous port incision.  A 5 mm trocar was placed just above into the left of the umbilicus  for the camera port and an additional 5 mm trocar in the left lateral abdomen.  A bilateral T AP block was performed under direct vision with Exparel.  The patient was placed in reverse Trendelenburg position.  Through a 5 mm subxiphoid site the [facility] retractor was placed in the left lobe of the liver elevated.  Adhesions directly over the band were divided with the hook cautery and the buckle and anterior portion of the band exposed.  There were fortunately minimal adhesions to the left lobe of the liver and using hook cautery under direct vision of the left lobe of the liver was mobilized up off the band and some filmy adhesions to the stomach just above the band.  The Nathanson retractor was repositioned with excellent exposure of the entire upper stomach and hiatus.  The plication could be seen clearly and using sharp dissection the plication was taken down working up toward the angle of Hiss.  Previous suture material was seen and the band was exposed working well up toward the angle of Hiss.  There were a few final attachments in this area which I felt would be better taken down after mobilizing the short gastrics.  However the fundus was very well mobilized at this point.  We then began a dissection along the greater curve vasculature with the harmonic scalpel and the lesser sac entered.  The dissection continued proximally and short gastrics were sequentially taken down with the harmonic scalpel.  The fundus was mobilized away from the spleen completely.  At this point we could see a few more adhesions from the band at the angle of Hiss and these were further completely divided and the left crus exposed and at this point the fundus  was completely dissected and free and the plication 100% mobilized.  There was no evidence of gastric injury.  The tissue appeared generally healthy.  We then continued the dissection of the greater curve distally until we were at a point measured 5 to 6 cm from the pylorus.   The 60 French VISI G tube was passed orally and its tip advanced to the pylorus.  Is positioned along the lesser curve and placed on continuous suction.  Was noted to be naturally somewhat angulated and L-shaped.  I began the sleeve with an initial firing of the green load 60 mm stapler taking a shallow angle into the stomach to stay well away from the incisura due to the angulation noted above.  A second firing of the gold load 60 mm stapler was used to continue the staple line proximally again staying well away from the tube at the pylorus.  Following this 3 firings of the 60 mm blue load stapler were used to continue the sleeve working proximally past the incisura at which point we came in close to but not tight against the VISI G-tube the resection continued up toward the angle of Hiss.  A final firing of the gold load 60 mm stapler was used to complete the sleeve with the final firing at right angles to the fundus and about a centimeter away from the EG junction.  The sleeve appeared symmetrical with no twisting or narrowing.  Under insufflation and saline irrigation there was no evidence of leak.  The VISI G-tube was removed.  Dr. Daphine Deutscher performed upper endoscopy showing no leak or bleeding or narrowing.  Attention was then turned toward the cholecystectomy.  One additional 5 mm trocar was placed in the right lateral abdomen.  There were some omental adhesions up around the fundus that were taken down.  The gallbladder itself did not appear significantly inflamed.  The fundus was elevated and the infundibulum exposed and retracted inferolaterally.  Peritoneum anterior and posterior to Calot's triangle was incised in the distal gallbladder thoroughly dissected.  The cystic artery was clearly seen coursing up onto the gallbladder wall and was isolated and divided between 2 proximal and one distal clip.  The gallbladder was further dissected and the cystic duct gallbladder junction dissected 360 degrees and the  cystic duct dissected out over about a centimeter.  The distal gallbladder was dissected away from the cholecystic plate.  The cystic duct was clipped of the gallbladder junction and an operative cholangiogram obtained through the cystic duct showing good filling of a normal common bile duct and intrahepatic ducts with free flow into the duodenum and no filling defects.  The Cholangiocath was removed and the cystic duct triply clipped proximally and divided.  The gallbladder was dissected away from the liver with hook cautery and placed intact in a eco-sac and removed through the 15 mm trocar site.  Complete hemostasis was assured at the operative site.  The gastric sleeve specimen was then removed through the 15 mm trocar site after dilating it slightly.  The abdomen was again irrigated and the upper abdomen carefully inspected and there was no evidence of bleeding or injury or other problems.  The staple line of the sleeve was coated with Evasel.  The fascial defect at the 15 mm trocar site was closed with 0 Vicryl with the Weck device.  The Nathanson retractor was removed under direct vision.  All CO2 was evacuated and trochars removed.  Skin incisions were closed with subcuticular Monocryl and  Dermabond.  Sponge needle and instrument counts were correct.    Findings: As above  Estimated Blood Loss:  less than 100 mL         Drains: None  Blood Given: none          Specimens: Greater curvature of stomach        Complications:  * No complications entered in OR log *         Disposition: PACU - hemodynamically stable.         Condition: stable

## 2017-12-04 NOTE — Progress Notes (Signed)
Patient very drowsy and dizzy upon dangling. Stood up and was able to pivot to the [facility] to void, but had to lie back down. Unable to walk or transfer to chair. Lina Sar, RN

## 2017-12-04 NOTE — Anesthesia Procedure Notes (Signed)
Procedure Name: Intubation Date/Time: 12/04/2017 11:10 AM Performed by: Lollie Sails, CRNA Pre-anesthesia Checklist: Patient identified, Emergency Drugs available, Suction available, Patient being monitored and Timeout performed Patient Re-evaluated:Patient Re-evaluated prior to induction Oxygen Delivery Method: Circle system utilized Preoxygenation: Pre-oxygenation with 100% oxygen Induction Type: IV induction Ventilation: Mask ventilation without difficulty Laryngoscope Size: 3 and Mac Grade View: Grade I Tube type: Oral Number of attempts: 1 Airway Equipment and Method: Stylet Placement Confirmation: ETT inserted through vocal cords under direct vision,  positive ETCO2 and breath sounds checked- equal and bilateral Secured at: 22 cm Tube secured with: Tape Dental Injury: Teeth and Oropharynx as per pre-operative assessment

## 2017-12-04 NOTE — Interval H&P Note (Signed)
History and Physical Interval Note:  12/04/2017 10:51 AM  Tiffany Ford  has presented today for surgery, with the diagnosis of MORBID OBESITY  The various methods of treatment have been discussed with the patient and family. After consideration of risks, benefits and other options for treatment, the patient has consented to  Procedure(s): LAPAROSCOPIC GASTRIC BAND REMOVAL WITH LAPAROSCOPIC GASTRIC SLEEVE RESECTION (N/A) LAPAROSCOPIC CHOLECYSTECTOMY WITH INTRAOPERATIVE CHOLANGIOGRAM (N/A) as a surgical intervention .  The patient's history has been reviewed, patient examined, no change in status, stable for surgery.  I have reviewed the patient's chart and labs.  Questions were answered to the patient's satisfaction.     Lorne Skeens [place] Mousseau

## 2017-12-04 NOTE — Op Note (Signed)
LEXIA VANDEVENDER [id number] 06-Jan-1971 12/04/2017  Preoperative diagnosis: removal of lapband and conversion to sleeve gastrectomy  Postoperative diagnosis: Same   Procedure: Upper endoscopy   Surgeon: Susy Frizzle B. Daphine Deutscher  M.D., FACS   Anesthesia: Gen.   Indications for procedure: This patient was undergoing a removal of lapband and conversion to a sleeve gastrectomy and cholecystectomy.    Description of procedure: The endoscopy was placed in the mouth and into the oropharynx and under endoscopic vision it was advanced to the esophagogastric junction.  The sleeve was insufflated and there was not much of a proximal pouch above the site of the prior lapband.  The sleeve was cylindrical and the antrum appropriate for the L shaped stomach.   No bleeding or leaks were detected.  The scope was withdrawn without difficulty.     Matt B. Daphine Deutscher, MD, FACS General, Bariatric, & Minimally Invasive Surgery [facility] Surgery, [place]

## 2017-12-04 NOTE — Discharge Instructions (Signed)
° ° ° °GASTRIC BYPASS/SLEEVE ° Home Care Instructions ° ° These instructions are to help you care for yourself when you go home. ° °Call: If you have any problems. °• Call [phone] and ask for the surgeon on call °• If you need immediate help, come to the ER at [place].  °• Tell the ER staff that you are a new post-op gastric bypass or gastric sleeve patient °  °Signs and symptoms to report: • Severe vomiting or nausea °o If you cannot keep down clear liquids for longer than 1 day, call your surgeon  °• Abdominal pain that does not get better after taking your pain medication °• Fever over 100.4° F with chills °• Heart beating over 100 beats a minute °• Shortness of breath at rest °• Chest pain °•  Redness, swelling, drainage, or foul odor at incision (surgical) sites °•  If your incisions open or pull apart °• Swelling or pain in calf (lower leg) °• Diarrhea (Loose bowel movements that happen often), frequent watery, uncontrolled bowel movements °• Constipation, (no bowel movements for 3 days) if this happens: Pick one °o Milk of Magnesia, 2 tablespoons by mouth, 3 times a day for 2 days if needed °o Stop taking Milk of Magnesia once you have a bowel movement °o Call your doctor if constipation continues °Or °o Miralax  (instead of Milk of Magnesia) following the label instructions °o Stop taking Miralax once you have a bowel movement °o Call your doctor if constipation continues °• Anything you think is not normal °  °Normal side effects after surgery: • Unable to sleep at night or unable to focus °• Irritability or moody °• Being tearful (crying) or depressed °These are common complaints, possibly related to your anesthesia medications that put you to sleep, stress of surgery, and change in lifestyle.  This usually goes away a few weeks after surgery.  If these feelings continue, call your primary care doctor. °  °Wound Care: You may have surgical glue, steri-strips, or staples over your incisions after  surgery °• Surgical glue:  Looks like a clear film over your incisions and will wear off a little at a time °• Steri-strips: Strips of tape over your incisions. You may notice a yellowish color on the skin under the steri-strips. This is used to make the   steri-strips stick better. Do not pull the steri-strips off - let them fall off °• Staples: Staples may be removed before you leave the hospital °o If you go home with staples, call [facility], [phone] at for an appointment with your surgeon’s nurse to have staples removed 10 days after surgery. °• Showering: You may shower two (2) days after your surgery unless your surgeon tells you differently °o Wash gently around incisions with warm soapy water, rinse well, and gently pat dry  °o No tub baths until staples are removed, steri-strips fall off or glue is gone.  °  °Medications: • Medications should be liquid or crushed if larger than the size of a dime °• Extended release pills (medication that release a little bit at a time through the day) should NOT be crushed or cut. (examples include XL, ER, DR, SR) °• Depending on the size and number of medications you take, you may need to space (take a few throughout the day)/change the time you take your medications so that you do not over-fill your pouch (smaller stomach) °• Make sure you follow-up with your primary care doctor to   make medication changes needed during rapid weight loss and life-style changes °• If you have diabetes, follow up with the doctor that orders your diabetes medication(s) within one week after surgery and check your blood sugar regularly. °• Do not drive while taking prescription pain medication  °• It is ok to take Tylenol by the bottle instructions with your pain medicine or instead of your pain medicine as needed.  DO NOT TAKE NSAIDS (EXAMPLES OF NSAIDS:  IBUPROFREN/ NAPROXEN)  °Diet:                    First 2 Weeks ° You will see the dietician t about two (2) weeks  after your surgery. The dietician will increase the types of foods you can eat if you are handling liquids well: °• If you have severe vomiting or nausea and cannot keep down clear liquids lasting longer than 1 day, call your surgeon @ ([phone]) °Protein Shake °• Drink at least 2 ounces of shake 5-6 times per day °• Each serving of protein shakes (usually 8 - 12 ounces) should have: °o 15 grams of protein  °o And no more than 5 grams of carbohydrate  °• Goal for protein each day: °o Men = 80 grams per day °o Women = 60 grams per day °• Protein powder may be added to fluids such as non-fat milk or Lactaid milk or unsweetened Soy/Almond milk (limit to 35 grams added protein powder per serving) ° °Hydration °• Slowly increase the amount of water and other clear liquids as tolerated (See Acceptable Fluids) °• Slowly increase the amount of protein shake as tolerated  °•  Sip fluids slowly and throughout the day.  Do not use straws. °• May use sugar substitutes in small amounts (no more than 6 - 8 packets per day; i.e. Splenda) ° °Fluid Goal °• The first goal is to drink at least 8 ounces of protein shake/drink per day (or as directed by the nutritionist); some examples of protein shakes are Syntrax Nectar, Adkins Advantage, EAS Edge HP, and Unjury. See handout from pre-op Bariatric Education Class: °o Slowly increase the amount of protein shake you drink as tolerated °o You may find it easier to slowly sip shakes throughout the day °o It is important to get your proteins in first °• Your fluid goal is to drink 64 - 100 ounces of fluid daily °o It may take a few weeks to build up to this °• 32 oz (or more) should be clear liquids  °And  °• 32 oz (or more) should be full liquids (see below for examples) °• Liquids should not contain sugar, caffeine, or carbonation ° °Clear Liquids: °• Water or Sugar-free flavored water (i.e. Fruit H2O, Propel) °• Decaffeinated coffee or tea (sugar-free) °• Crystal Lite, Wyler’s Lite,  Minute Maid Lite °• Sugar-free Jell-O °• Bouillon or broth °• Sugar-free Popsicle:   *Less than 20 calories each; Limit 1 per day ° °Full Liquids: °Protein Shakes/Drinks + 2 choices per day of other full liquids °• Full liquids must be: °o No More Than 15 grams of Carbs per serving  °o No More Than 3 grams of Fat per serving °• Strained low-fat cream soup (except Cream of Potato or Tomato) °• Non-Fat milk °• Fat-free Lactaid Milk °• Unsweetened Soy Or Unsweetened Almond Milk °• Low Sugar yogurt (Dannon Lite & Fit, Greek yogurt; Oikos Triple Zero; Chobani Simply 100; Yoplait 100 calorie Greek - No Fruit on the Bottom) ° °  °Vitamins   and Minerals • Start 1 day after surgery unless otherwise directed by your surgeon °• 2 Chewable Bariatric Specific Multivitamin / Multimineral Supplement with iron (Example: Bariatric Advantage Multi EA) °• Chewable Calcium with Vitamin D-3 °(Example: 3 Chewable Calcium Plus 600 with Vitamin D-3) °o Take 500 mg three (3) times a day for a total of 1500 mg each day °o Do not take all 3 doses of calcium at one time as it may cause constipation, and you can only absorb 500 mg  at a time  °o Do not mix multivitamins containing iron with calcium supplements; take 2 hours apart °• Menstruating women and those with a history of anemia (a blood disease that causes weakness) may need extra iron °o Talk with your doctor to see if you need more iron °• Do not stop taking or change any vitamins or minerals until you talk to your dietitian or surgeon °• Your Dietitian and/or surgeon must approve all vitamin and mineral supplements °  °Activity and Exercise: Limit your physical activity as instructed by your doctor.  It is important to continue walking at home.  During this time, use these guidelines: °• Do not lift anything greater than ten (10) pounds for at least two (2) weeks °• Do not go back to work or drive until your surgeon says you can °• You may have sex when you feel comfortable  °o It is  VERY important for female patients to use a reliable birth control method; fertility often increases after surgery  °o All hormonal birth control will be ineffective for 30 days after surgery due to medications given during surgery a barrier method must be used. °o Do not get pregnant for at least 18 months °• Start exercising as soon as your doctor tells you that you can °o Make sure your doctor approves any physical activity °• Start with a simple walking program °• Walk 5-15 minutes each day, 7 days per week.  °• Slowly increase until you are walking 30-45 minutes per day °Consider joining our BELT program. [phone] or email [email or web address] °  °Special Instructions Things to remember: °• Use your CPAP when sleeping if this applies to you ° °• [facility] has two free Bariatric Surgery Support Groups that meet monthly °o The 3rd Thursday of each month, 6 pm, [facility]  °o The 2nd Friday of each month, 11:45 am in the private dining room in the basement of [place] °• It is very important to keep all follow up appointments with your surgeon, dietitian, primary care physician, and behavioral health practitioner °• Routine follow up schedule with your surgeon include appointments at 2-3 weeks, 6-8 weeks, 6 months, and 1 year at a minimum.  Your surgeon may request to see you more often.   °o After the first year, please follow up with your bariatric surgeon and dietitian at least once a year in order to maintain best weight loss results °[facility]: [phone] °[facility]: [phone] °Bariatric Nurse Coordinator: [phone] °  °   Reviewed and Endorsed  °by [place] Patient Education Committee, June, 2016 °Edits Approved: Aug, 2018 ° ° ° °

## 2017-12-04 NOTE — Anesthesia Postprocedure Evaluation (Signed)
Anesthesia Post Note  Patient: Tiffany Ford  Procedure(s) Performed: LAPAROSCOPIC GASTRIC BAND REMOVAL WITH LAPAROSCOPIC GASTRIC SLEEVE RESECTION AND UPPER ENDOSCOPY (N/A Abdomen) LAPAROSCOPIC CHOLECYSTECTOMY WITH INTRAOPERATIVE CHOLANGIOGRAM (N/A Abdomen)     Patient location during evaluation: PACU Anesthesia Type: General Level of consciousness: awake and alert Pain management: pain level controlled Vital Signs Assessment: post-procedure vital signs reviewed and stable Respiratory status: spontaneous breathing, nonlabored ventilation, respiratory function stable and patient connected to nasal cannula oxygen Cardiovascular status: blood pressure returned to baseline and stable Postop Assessment: no apparent nausea or vomiting Anesthetic complications: no    Last Vitals:  Vitals:   12/04/17 0955 12/04/17 1443  BP: (!) 146/96 106/65  Pulse:  78  Resp:  16  Temp:  36.4 C  SpO2:  99%    Last Pain:  Vitals:   12/04/17 1445  PainSc: Asleep                 Artisha Capri

## 2017-12-04 NOTE — Anesthesia Preprocedure Evaluation (Signed)
Anesthesia Evaluation  Patient identified by MRN, date of birth, ID band Patient awake    Reviewed: Allergy & Precautions, H&P , NPO status , Patient's Chart, lab work & pertinent test results, reviewed documented beta blocker date and time   Airway Mallampati: II  TM Distance: >3 FB Neck ROM: Full    Dental no notable dental hx.    Pulmonary neg pulmonary ROS,    Pulmonary exam normal breath sounds clear to auscultation       Cardiovascular Exercise Tolerance: Good negative cardio ROS Normal cardiovascular exam Rhythm:Regular Rate:Normal     Neuro/Psych negative neurological ROS  negative psych ROS   GI/Hepatic negative GI ROS, Neg liver ROS,   Endo/Other  negative endocrine ROS  Renal/GU negative Renal ROS  negative genitourinary   Musculoskeletal negative musculoskeletal ROS (+)   Abdominal (+) + obese,   Peds negative pediatric ROS (+)  Hematology negative hematology ROS (+)   Anesthesia Other Findings   Reproductive/Obstetrics negative OB ROS                             Anesthesia Physical Anesthesia Plan  ASA: II  Anesthesia Plan: General   Post-op Pain Management:    Induction: Intravenous  PONV Risk Score and Plan: 3 and Ondansetron, Dexamethasone, Treatment may vary due to age or medical condition and Propofol infusion  Airway Management Planned: Oral ETT  Additional Equipment:   Intra-op Plan:   Post-operative Plan: Extubation in OR  Informed Consent: I have reviewed the patients History and Physical, chart, labs and discussed the procedure including the risks, benefits and alternatives for the proposed anesthesia with the patient or authorized representative who has indicated his/her understanding and acceptance.   Dental Advisory Given  Plan Discussed with: CRNA, Anesthesiologist and Surgeon  Anesthesia Plan Comments: (  )        Anesthesia Quick  Evaluation

## 2017-12-04 NOTE — Transfer of Care (Signed)
Immediate Anesthesia Transfer of Care Note  Patient: Tiffany Ford  Procedure(s) Performed: LAPAROSCOPIC GASTRIC BAND REMOVAL WITH LAPAROSCOPIC GASTRIC SLEEVE RESECTION AND UPPER ENDOSCOPY (N/A Abdomen) LAPAROSCOPIC CHOLECYSTECTOMY WITH INTRAOPERATIVE CHOLANGIOGRAM (N/A Abdomen)  Patient Location: PACU  Anesthesia Type:General  Level of Consciousness: sedated  Airway & Oxygen Therapy: Patient Spontanous Breathing and Patient connected to face mask oxygen  Post-op Assessment: Report given to RN and Post -op Vital signs reviewed and stable  Post vital signs: Reviewed and stable  Last Vitals:  Vitals Value Taken Time  BP    Temp    Pulse 75 12/04/2017  2:44 PM  Resp 13 12/04/2017  2:44 PM  SpO2 98 % 12/04/2017  2:44 PM  Vitals shown include unvalidated device data.  Last Pain: There were no vitals filed for this visit.       Complications: No apparent anesthesia complications

## 2017-12-05 ENCOUNTER — Encounter (HOSPITAL_COMMUNITY): Payer: Self-pay | Admitting: General Surgery

## 2017-12-05 LAB — CBC WITH DIFFERENTIAL/PLATELET
BASOS ABS: 0 10*3/uL (ref 0.0–0.1)
Basophils Relative: 0 %
Eosinophils Absolute: 0 10*3/uL (ref 0.0–0.7)
Eosinophils Relative: 0 %
HEMATOCRIT: 36.5 % (ref 36.0–46.0)
HEMOGLOBIN: 11.9 g/dL — AB (ref 12.0–15.0)
LYMPHS PCT: 8 %
Lymphs Abs: 1.1 10*3/uL (ref 0.7–4.0)
MCH: 30.3 pg (ref 26.0–34.0)
MCHC: 32.6 g/dL (ref 30.0–36.0)
MCV: 92.9 fL (ref 78.0–100.0)
Monocytes Absolute: 0.8 10*3/uL (ref 0.1–1.0)
Monocytes Relative: 5 %
NEUTROS ABS: 12.4 10*3/uL (ref 1.7–7.7)
Neutrophils Relative %: 87 %
Platelets: 268 10*3/uL (ref 150–400)
RBC: 3.93 MIL/uL (ref 3.87–5.11)
RDW: 13.9 % (ref 11.5–15.5)
WBC: 14.3 10*3/uL — AB (ref 4.0–10.5)

## 2017-12-05 NOTE — Plan of Care (Signed)
Nutrition Education Note  Received consult for diet education per DROP protocol.   Discussed 2 week post op diet with pt. Emphasized that liquids must be non carbonated, non caffeinated, and sugar free. Fluid goals discussed. Pt to follow up with outpatient bariatric RD for further diet progression after 2 weeks. Multivitamins and minerals also reviewed. Teach back method used, pt expressed understanding, expect good compliance.   Diet: First 2 Weeks  You will see the nutritionist about two (2) weeks after your surgery. The nutritionist will increase the types of foods you can eat if you are handling liquids well:  If you have severe vomiting or nausea and cannot handle clear liquids lasting longer than 1 day, call your surgeon  Protein Shake  Drink at least 2 ounces of shake 5-6 times per day  Each serving of protein shakes (usually 8 - 12 ounces) should have a minimum of:  15 grams of protein  And no more than 5 grams of carbohydrate  Goal for protein each day:  Men = 80 grams per day  Women = 60 grams per day  Protein powder may be added to fluids such as non-fat milk or Lactaid milk or Soy milk (limit to 35 grams added protein powder per serving)   Hydration  Slowly increase the amount of water and other clear liquids as tolerated (See Acceptable Fluids)  Slowly increase the amount of protein shake as tolerated  Sip fluids slowly and throughout the day  May use sugar substitutes in small amounts (no more than 6 - 8 packets per day; i.e. Splenda)   Fluid Goal  The first goal is to drink at least 8 ounces of protein shake/drink per day (or as directed by the nutritionist); some examples of protein shakes are Premier Protein, Syntrax Nectar, Adkins Advantage, EAS Edge HP, and Unjury. See handout from pre-op Bariatric Education Class:  Slowly increase the amount of protein shake you drink as tolerated  You may find it easier to slowly sip shakes throughout the day  It is important to  get your proteins in first  Your fluid goal is to drink 64 - 100 ounces of fluid daily  It may take a few weeks to build up to this  32 oz (or more) should be clear liquids  And  32 oz (or more) should be full liquids (see below for examples)  Liquids should not contain sugar, caffeine, or carbonation   Clear Liquids:  Water or Sugar-free flavored water (i.e. Fruit H2O, Propel)  Decaffeinated coffee or tea (sugar-free)  Crystal Lite, Wyler?s Lite, Minute Maid Lite  Sugar-free Jell-O  Bouillon or broth  Sugar-free Popsicle: *Less than 20 calories each; Limit 1 per day   Full Liquids:  Protein Shakes/Drinks + 2 choices per day of other full liquids  Full liquids must be:  No More Than 12 grams of Carbs per serving  No More Than 3 grams of Fat per serving  Strained low-fat cream soup  Non-Fat milk  Fat-free Lactaid Milk  Sugar-free yogurt (Dannon Lite & Fit, Greek yogurt, Oikos Zero)   Naarah Borgerding RD, LDN Clinical Nutrition Pager # - [phone]   

## 2017-12-05 NOTE — Progress Notes (Signed)
Patient ID: Tiffany Ford, female   DOB: June 11, 1971, 47 y.o.   MRN: [id number] 1 Day Post-Op   Subjective: Doing well this morning.  Denies pain or nausea.  Has been ambulatory.  Objective: Vital signs in last 24 hours: Temp:  [97.6 F (36.4 C)-99.9 F (37.7 C)] 98.4 F (36.9 C) (05/07 0515) Pulse Rate:  [50-91] 65 (05/07 0616) Resp:  [14-21] 18 (05/07 0515) BP: (106-152)/(65-96) 147/86 (05/07 0616) SpO2:  [96 %-100 %] 97 % (05/07 0616) Weight:  [93.1 kg (205 lb 3.2 oz)-93.6 kg (206 lb 4.8 oz)] 93.6 kg (206 lb 4.8 oz) (05/07 0635)    Intake/Output from previous day: 05/06 0701 - 05/07 0700 In: 3048.3 [P.O.:180; I.V.:2818.3; IV Piggyback:50] Out: 1800 [Urine:1750; Blood:50] Intake/Output this shift: No intake/output data recorded.  General appearance: alert, cooperative and no distress GI: Mild to moderate epigastric tenderness. Incision/Wound: No erythema or drainage.  Lab Results:  Recent Labs    12/04/17 1852 12/05/17 0435  WBC  --  14.3*  HGB 12.6 11.9*  HCT 38.3 36.5  PLT  --  268   BMET No results for input(s): NA, K, CL, CO2, GLUCOSE, BUN, CREATININE, CALCIUM in the last 72 hours.   Studies/Results: Dg Cholangiogram Operative  Result Date: 12/04/2017 CLINICAL DATA:  47 year old female with a history cholelithiasis EXAM: INTRAOPERATIVE CHOLANGIOGRAM TECHNIQUE: Cholangiographic images from the C-arm fluoroscopic device were submitted for interpretation post-operatively. Please see the procedural report for the amount of contrast and the fluoroscopy time utilized. COMPARISON:  None. FINDINGS: Surgical instruments project over the upper abdomen. There is cannulation of the cystic duct/gallbladder neck, with antegrade infusion of contrast. Caliber of the extrahepatic ductal system within normal limits. No definite filling defect within the extrahepatic ducts identified. Free flow of contrast across the ampulla. IMPRESSION: Intraoperative cholangiogram demonstrates  extrahepatic biliary ducts of unremarkable caliber, with no definite filling defects identified. Free flow of contrast across the ampulla. Please refer to the dictated operative report for full details of intraoperative findings and procedure Electronically Signed   By: Gilmer Mor D.O.   On: 12/04/2017 13:58    Anti-infectives: Anti-infectives (From admission, onward)   Start     Dose/Rate Route Frequency Ordered Stop   12/04/17 0918  cefoTEtan in Dextrose 5% (CEFOTAN) IVPB 2 g     2 g Intravenous On call to O.R. 12/04/17 0918 12/04/17 1115      Assessment/Plan: s/p Procedure(s): LAPAROSCOPIC GASTRIC BAND REMOVAL WITH LAPAROSCOPIC GASTRIC SLEEVE RESECTION AND UPPER ENDOSCOPY LAPAROSCOPIC CHOLECYSTECTOMY WITH INTRAOPERATIVE CHOLANGIOGRAM General  appears to be doing very well.  Some moderate leukocytosis and epigastric tenderness.  Plan to observe in hospital today and monitor oral intake.  Repeat CBC tomorrow.   LOS: 1 day    Mariella Saa 12/05/2017

## 2017-12-05 NOTE — Progress Notes (Signed)
Patient alert and oriented, Post op day 1.  Provided support and encouragement.  Encouraged pulmonary toilet, ambulation and small sips of liquids.  Working on clear fluids.  All questions answered.  Will continue to monitor.

## 2017-12-06 ENCOUNTER — Encounter (HOSPITAL_COMMUNITY): Payer: Self-pay

## 2017-12-06 LAB — CBC WITH DIFFERENTIAL/PLATELET
Basophils Absolute: 0 10*3/uL (ref 0.0–0.1)
Basophils Relative: 0 %
EOS ABS: 0.1 10*3/uL (ref 0.0–0.7)
Eosinophils Relative: 1 %
HCT: 34.2 % — ABNORMAL LOW (ref 36.0–46.0)
HEMOGLOBIN: 10.9 g/dL — AB (ref 12.0–15.0)
Lymphocytes Relative: 23 %
Lymphs Abs: 1.7 10*3/uL (ref 0.7–4.0)
MCH: 30.2 pg (ref 26.0–34.0)
MCHC: 31.9 g/dL (ref 30.0–36.0)
MCV: 94.7 fL (ref 78.0–100.0)
MONOS PCT: 6 %
Monocytes Absolute: 0.4 10*3/uL (ref 0.1–1.0)
NEUTROS PCT: 70 %
Neutro Abs: 5.4 10*3/uL (ref 1.7–7.7)
PLATELETS: 236 10*3/uL (ref 150–400)
RBC: 3.61 MIL/uL — ABNORMAL LOW (ref 3.87–5.11)
RDW: 14.3 % (ref 11.5–15.5)
WBC: 7.6 10*3/uL (ref 4.0–10.5)

## 2017-12-06 MED ORDER — OXYCODONE HCL 5 MG/5ML PO SOLN: 5.0000 mg | ORAL | 0 refills | Status: AC | PRN

## 2017-12-06 NOTE — Progress Notes (Signed)
Discharge instructions discussed with patient and family, verbalizes agreement and understanding,

## 2017-12-06 NOTE — Progress Notes (Signed)
Patient alert and oriented, pain is controlled. Patient is tolerating fluids, advanced to protein shake today, patient is tolerating well.  Reviewed Gastric sleeve discharge instructions with patient and patient is able to articulate understanding.  Provided information on BELT program, Support Group and [facility] outpatient pharmacy. All questions answered, will continue to monitor.   24 hour fluid recall 537 Per Dehydration Protocol patient call back Friday May 10

## 2017-12-06 NOTE — Progress Notes (Signed)
Patient ID: Tiffany Ford, female   DOB: 10-20-1970, 47 y.o.   MRN: [id number] 2 Days Post-Op   Subjective: Doing better this morning.  Was nauseated yesterday and last night without vomiting.  This is resolved this morning.  Tolerating water and shakes well.  Pain well controlled.  Ambulatory.  Objective: Vital signs in last 24 hours: Temp:  [98.2 F (36.8 C)-98.8 F (37.1 C)] 98.6 F (37 C) (05/08 0551) Pulse Rate:  [60-74] 60 (05/08 0551) Resp:  [15-20] 20 (05/08 0551) BP: (130-154)/(76-97) 154/83 (05/08 0551) SpO2:  [91 %-99 %] 91 % (05/08 0551) Weight:  [92.7 kg (204 lb 5.9 oz)] 92.7 kg (204 lb 5.9 oz) (05/08 0500)    Intake/Output from previous day: 05/07 0701 - 05/08 0700 In: 1816.7 [P.O.:360; I.V.:1456.7] Out: 2300 [Urine:2300] Intake/Output this shift: No intake/output data recorded.  General appearance: alert, cooperative and no distress GI: Nondistended.  Mild epigastric tenderness improved from yesterday. Incision/Wound: No erythema or drainage.  Lab Results:  Recent Labs    12/05/17 0435 12/06/17 0448  WBC 14.3* 7.6  HGB 11.9* 10.9*  HCT 36.5 34.2*  PLT 268 236   BMET No results for input(s): NA, K, CL, CO2, GLUCOSE, BUN, CREATININE, CALCIUM in the last 72 hours.   Studies/Results: Dg Cholangiogram Operative  Result Date: 12/04/2017 CLINICAL DATA:  47 year old female with a history cholelithiasis EXAM: INTRAOPERATIVE CHOLANGIOGRAM TECHNIQUE: Cholangiographic images from the C-arm fluoroscopic device were submitted for interpretation post-operatively. Please see the procedural report for the amount of contrast and the fluoroscopy time utilized. COMPARISON:  None. FINDINGS: Surgical instruments project over the upper abdomen. There is cannulation of the cystic duct/gallbladder neck, with antegrade infusion of contrast. Caliber of the extrahepatic ductal system within normal limits. No definite filling defect within the extrahepatic ducts identified. Free flow  of contrast across the ampulla. IMPRESSION: Intraoperative cholangiogram demonstrates extrahepatic biliary ducts of unremarkable caliber, with no definite filling defects identified. Free flow of contrast across the ampulla. Please refer to the dictated operative report for full details of intraoperative findings and procedure Electronically Signed   By: Gilmer Mor D.O.   On: 12/04/2017 13:58    Anti-infectives: Anti-infectives (From admission, onward)   Start     Dose/Rate Route Frequency Ordered Stop   12/04/17 0918  cefoTEtan in Dextrose 5% (CEFOTAN) IVPB 2 g     2 g Intravenous On call to O.R. 12/04/17 0918 12/04/17 1115      Assessment/Plan: s/p Procedure(s): LAPAROSCOPIC GASTRIC BAND REMOVAL WITH LAPAROSCOPIC GASTRIC SLEEVE RESECTION AND UPPER ENDOSCOPY LAPAROSCOPIC CHOLECYSTECTOMY WITH INTRAOPERATIVE CHOLANGIOGRAM Doing well postoperatively without complication.  Okay for discharge today.   LOS: 2 days    Mariella Saa 12/06/2017

## 2017-12-06 NOTE — Discharge Summary (Signed)
  Patient ID: Tiffany Ford [id number] 46 y.o. 1970/09/13  12/04/2017  Discharge date and time: 12/06/2017   Admitting Physician: Mariella Saa  Discharge Physician: Mariella Saa  Admission Diagnoses: MORBID OBESITY complication of gastric band and cholelithiasis  Discharge Diagnoses: Same  Operations: Procedure(s): LAPAROSCOPIC GASTRIC BAND REMOVAL WITH LAPAROSCOPIC GASTRIC SLEEVE RESECTION AND UPPER ENDOSCOPY LAPAROSCOPIC CHOLECYSTECTOMY WITH INTRAOPERATIVE CHOLANGIOGRAM  Admission Condition: fair  Discharged Condition: good  Indication for Admission: Patient with progressive morbid obesity unresponsive to multiple efforts at medical management who presents with a BMI of 44 and comorbidities of joint pain. I believe there would be very significant medical benefit from surgical weight loss. She has had a lap band with inadequate weight loss. I had discussed with her that I would certainly be willing to try to more aggressively fill her band to see if we can get further weight loss with a lap band. However due to the discomfort she has and with her previous experience with fills she does not want to pursue this route. After our discussion of surgical options currently available the patient had previously decided to proceed with laparoscopic sleeve gastrectomy. One of her main reasons to want to convert from the band is pain around the port site. It is little difficult to tell if this is port pain or secondary to cholelithiasis but I suspect the former. The ideal treatment would be to remove her band with conversion to sleeve gastrectomy and cholecystectomy simultaneously. She has been approved for this procedure and successfully completed her workup.    Hospital Course: On the morning of admission the patient underwent an uneventful laparoscopic removal of her lap band and components and conversion to sleeve gastrectomy as well as cholecystectomy with negative intraoperative  cholangiogram.  She tolerated the procedure well.  There were no postoperative complications.  On the first postoperative day she had some moderate leukocytosis and some epigastric tenderness and nausea.  We elected to observe for another 24 hours.  Her nausea resolved by the second postoperative day.  She had only mild pain well controlled with medications.  Tenderness improved and leukocytosis resolved.  She is ambulatory.  Incisions healing well.   Disposition: Home  Patient Instructions:  Allergies as of 12/06/2017   No Known Allergies     Medication List    STOP taking these medications   naproxen 500 MG tablet Commonly known as:  NAPROSYN     TAKE these medications   methocarbamol 500 MG tablet Commonly known as:  ROBAXIN Take 1 tablet by mouth 2 (two) times daily as needed.   oxyCODONE 5 MG/5ML solution Commonly known as:  ROXICODONE Take 5-10 mLs (5-10 mg total) by mouth every 4 (four) hours as needed for moderate pain or severe pain.       Activity: activity as tolerated Diet: Bariatric protein shakes Wound Care: none needed  Follow-up:  With Dr. Johna Sheriff in 3 weeks.  Signed: Mariella Saa MD, FACS  12/06/2017, 7:44 AM

## 2017-12-08 ENCOUNTER — Telehealth (HOSPITAL_COMMUNITY): Payer: Self-pay

## 2017-12-08 NOTE — Telephone Encounter (Signed)
Patient called to discuss post bariatric surgery follow up questions.  See below:   1.  Tell me about your pain and pain management?some right sided pain that requires pain medication.  Medication does take pain away.  Has not been using tylenol we discussed trying this medication with or instead of roxicodone  2.  Let's talk about fluid intake.  How much total fluid are you taking in?50 ounces of fluid  3.  How much protein have you taken in the last 2 days?60 grams of protein  4.  Have you had nausea?  Tell me about when have experienced nausea and what you did to help?had to take nausea medication yesterday. Nausea relieved with zofran  5.  Has the frequency or color changed with your urine?frequently urinating no problems has gotten lighter in color  6.  Tell me what your incisions look like?no problems with incision  7.  Have you been passing gas? BM?yes passing gas and has had bm this am  8.  If a problem or question were to arise who would you call?  Do you know contact numbers for [facility], [facility], and [facility]?is aware of how to contact all agencies  9.  How has the walking going?walking every hour and using incentive spirometer  10.  How are your vitamins and calcium going?  How are you taking them?taking vitamins twice per day and calcium three times per day

## 2017-12-19 ENCOUNTER — Encounter: Payer: BLUE CROSS/BLUE SHIELD | Attending: General Surgery | Admitting: Skilled Nursing Facility1

## 2017-12-19 DIAGNOSIS — Z713 Dietary counseling and surveillance: Secondary | ICD-10-CM | POA: Insufficient documentation

## 2017-12-19 DIAGNOSIS — Z6841 Body Mass Index (BMI) 40.0 and over, adult: Secondary | ICD-10-CM | POA: Insufficient documentation

## 2017-12-19 DIAGNOSIS — E669 Obesity, unspecified: Secondary | ICD-10-CM

## 2017-12-20 ENCOUNTER — Encounter: Payer: Self-pay | Admitting: Skilled Nursing Facility1

## 2017-12-20 NOTE — Progress Notes (Signed)
Bariatric Class:  Appt start time: 1530 end time:  1630.  2 Week Post-Operative Nutrition Class  Patient was seen on 12/19/2017 for Post-Operative Nutrition education at [facility].   Surgery date: 12/04/2017 Surgery type: Revision of band to sleeve Start weight at [facility]: 202.1 Weight today: 191  TANITA  BODY COMP RESULTS  12/19/2017   BMI (kg/m^2) 38.6   Fat Mass (lbs) 89   Fat Free Mass (lbs) 102   Total Body Water (lbs) 73   The following the learning objectives were met by the patient during this course:  Identifies Phase 3A (Soft, High Proteins) Dietary Goals and will begin from 2 weeks post-operatively to 2 months post-operatively  Identifies appropriate sources of fluids and proteins   States protein recommendations and appropriate sources post-operatively  Identifies the need for appropriate texture modifications, mastication, and bite sizes when consuming solids  Identifies appropriate multivitamin and calcium sources post-operatively  Describes the need for physical activity post-operatively and will follow MD recommendations  States when to call healthcare provider regarding medication questions or post-operative complications  Handouts given during class include:  Phase 3A: Soft, High Protein Diet Handout  Follow-Up Plan: Patient will follow-up at [facility] in 6 weeks for 2 month post-op nutrition visit for diet advancement per MD.

## 2017-12-22 ENCOUNTER — Telehealth: Payer: Self-pay | Admitting: Registered"

## 2017-12-22 NOTE — Telephone Encounter (Signed)
RD called pt to verify fluid intake once starting soft, solid proteins 2 week post-bariatric surgery.   Daily Fluid intake: 50-55 oz Daily Protein intake: 50-55 grams  Concerns/issues: none stated

## 2018-01-30 ENCOUNTER — Encounter: Payer: BLUE CROSS/BLUE SHIELD | Attending: General Surgery | Admitting: Skilled Nursing Facility1

## 2018-01-30 ENCOUNTER — Encounter: Payer: Self-pay | Admitting: Skilled Nursing Facility1

## 2018-01-30 DIAGNOSIS — Z6841 Body Mass Index (BMI) 40.0 and over, adult: Secondary | ICD-10-CM | POA: Insufficient documentation

## 2018-01-30 DIAGNOSIS — Z713 Dietary counseling and surveillance: Secondary | ICD-10-CM | POA: Diagnosis not present

## 2018-01-30 NOTE — Patient Instructions (Addendum)
-  Stick to eating t the table  -Avoid eating in your bedroom  -Continue to chew well  -Continue to not drink with meals  -Continue to take small bites

## 2018-01-30 NOTE — Progress Notes (Signed)
Follow-up visit:  8 Weeks Post-Operative Sleeve Gastrectomy Surgery  Primary concerns today: Post-operative Bariatric Surgery Nutrition Management.  Pt states she gets acid reflux with canned tuna. Pt states at night while sleeping she gets acid reflux starting 4-5 days ago. Pt states she sits in the bed while eating. Pt states she is working on increasing her physical activity.  Pt is eating during the day and not eating while at work and only sleeping a total of 4 hours not consecutive.    Surgery date: 12/04/2017 Surgery type: Revision of band to sleeve [facility] Weight today: pt declined  TANITA  BODY COMP RESULTS  12/19/2017   BMI (kg/m^2) 38.6   Fat Mass (lbs) 89   Fat Free Mass (lbs) 102   Total Body Water (lbs) 73   24-hr recall: B (AM): premier protein shake or egg and sausage Snk (AM):  L (PM): chicken Snk (PM):  yogurt D (PM): pack of tuna Snk (PM):   Fluid intake: water with flavorings, gatorade zero Estimated total protein intake: 64+  Medications: see list Supplementation: multivitamin and calcium   Using straws: no Drinking while eating: no Having you been chewing well: yes Chewing/swallowing difficulties: no Changes in vision: no Changes to mood/headaches: no Hair loss/Cahnges to skin/Changes to nails: no Any difficulty focusing or concentrating: no Sweating: no Dizziness/Lightheaded: no Palpitations: no  Carbonated beverages: no N/V/D/C/GAS: no Abdominal Pain: no Dumping syndrome: no  Recent physical activity:  15-20 minutes 3 days a week  Progress Towards Goal(s):  In progress.  Handouts given during visit include:  Non-starchy veggies + protein    Nutritional Diagnosis:  [place]-3.3 Overweight/obesity related to past poor dietary habits and physical inactivity as evidenced by patient w/ recent sleeve surgery following dietary guidelines for continued weight loss.    Intervention:  Nutrition counseling. Dietitian educated the  pt on the advancment of her diet to include non-starchy vegetables. Goals: -Flip your sleeping and eating schedule around  Teaching Method Utilized:  Visual Auditory Hands on  Barriers to learning/adherence to lifestyle change: work schedule  Demonstrated degree of understanding via:  Teach Back   Monitoring/Evaluation:  Dietary intake, exercise, and body weight.

## 2018-03-12 ENCOUNTER — Ambulatory Visit: Payer: BLUE CROSS/BLUE SHIELD | Admitting: Skilled Nursing Facility1

## 2019-03-26 IMAGING — MG DIGITAL SCREENING BILATERAL MAMMOGRAM WITH TOMO AND CAD
6 of 10 series · 6 of 30 positions shown · non-contrast
Comparison: Previous exam(s).

ACR Breast Density Category a: The breast tissue is almost entirely
fatty.

CLINICAL DATA: Screening.

EXAM:
DIGITAL SCREENING BILATERAL MAMMOGRAM WITH TOMO AND CAD

[R MLO synth-2D (1 of 2)]
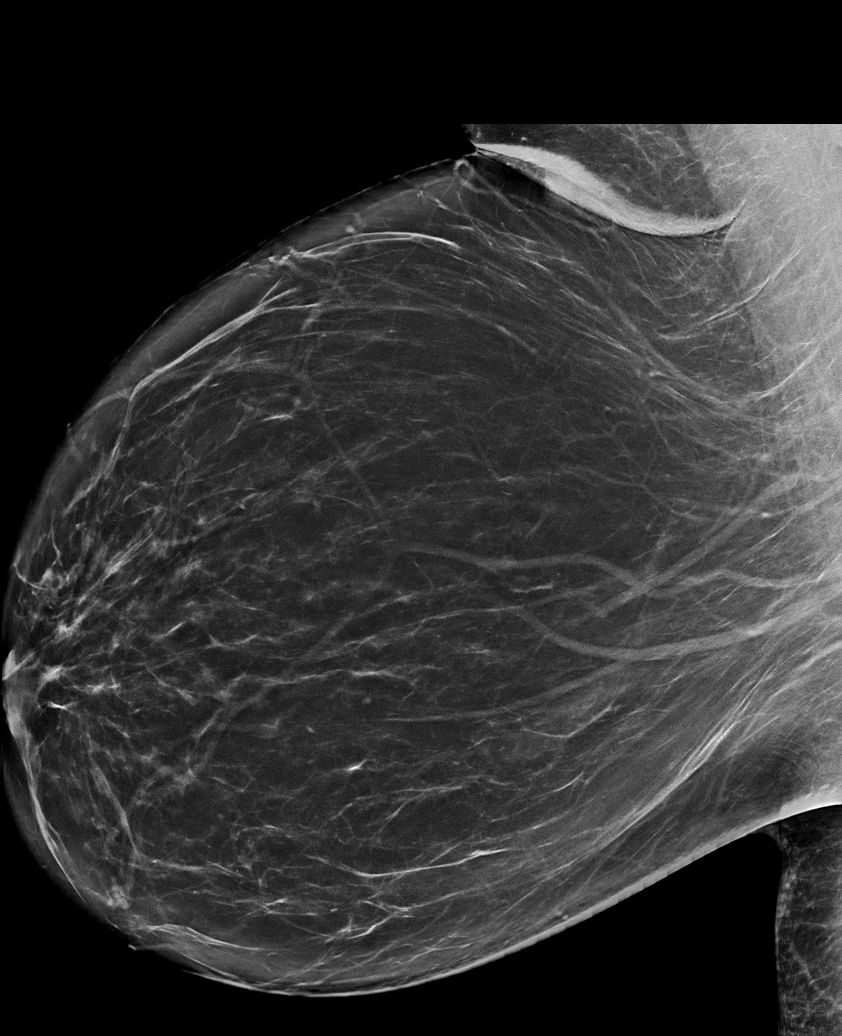

[R CC synth-2D]
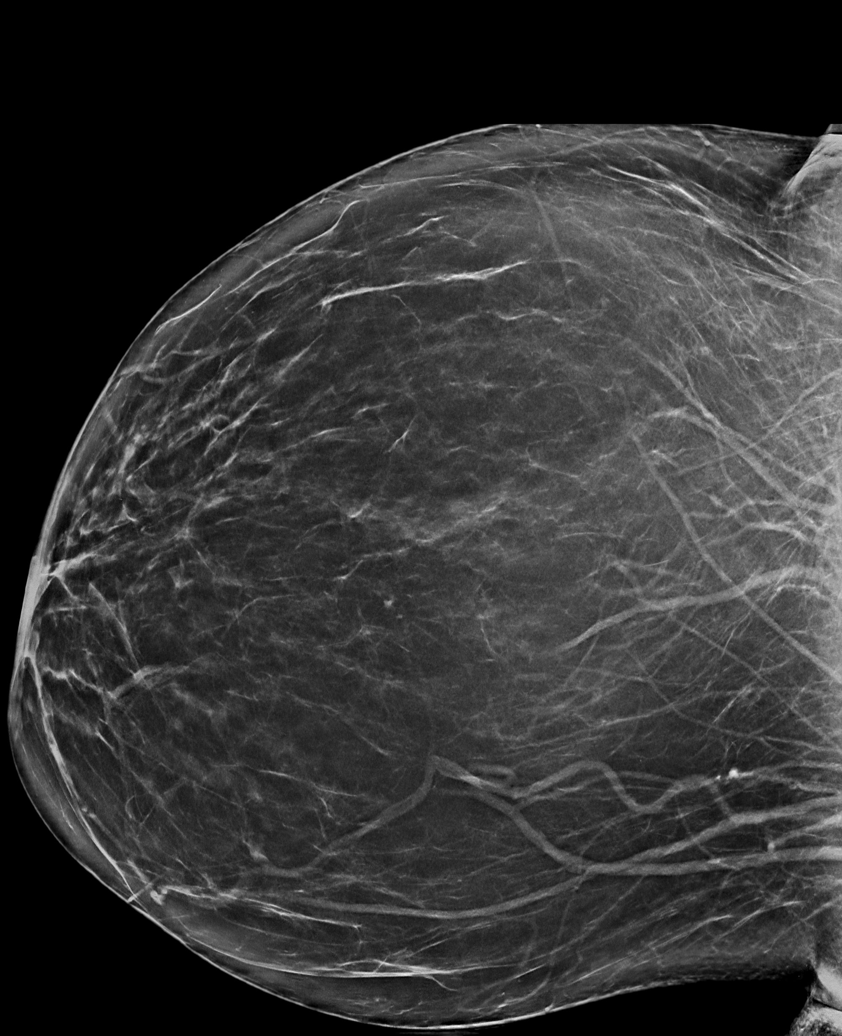

[L MLO synth-2D]
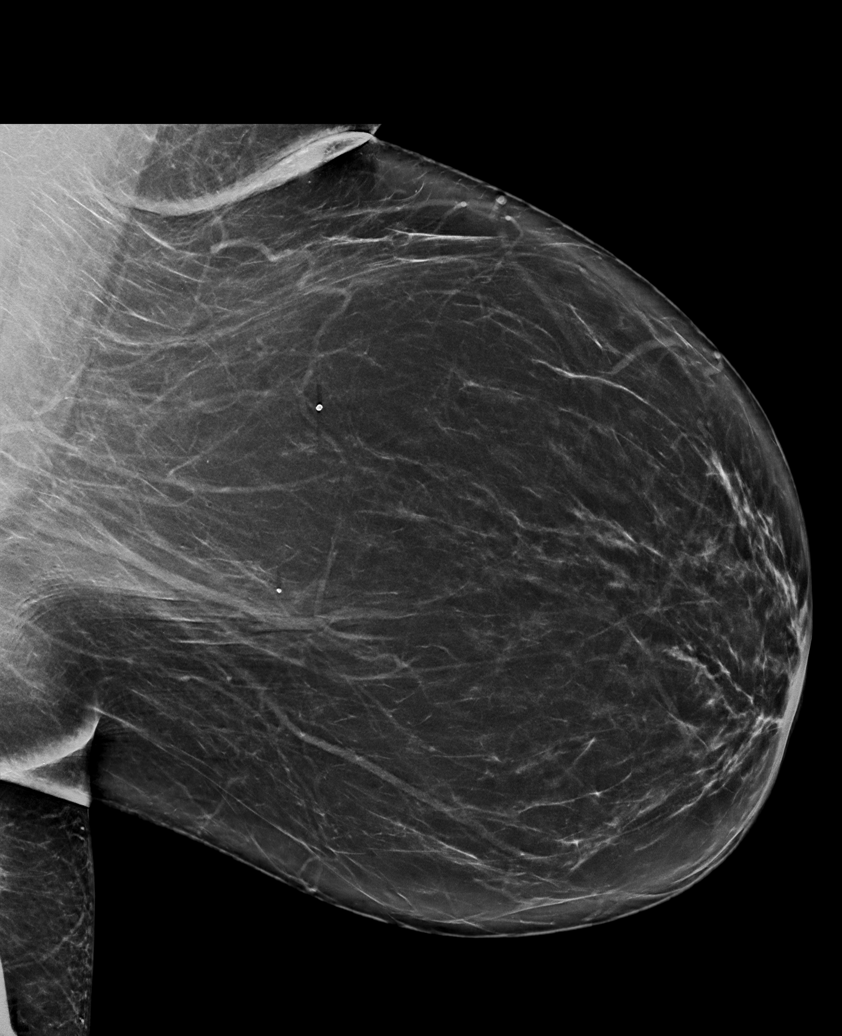

[R MLO synth-2D (2 of 2)]
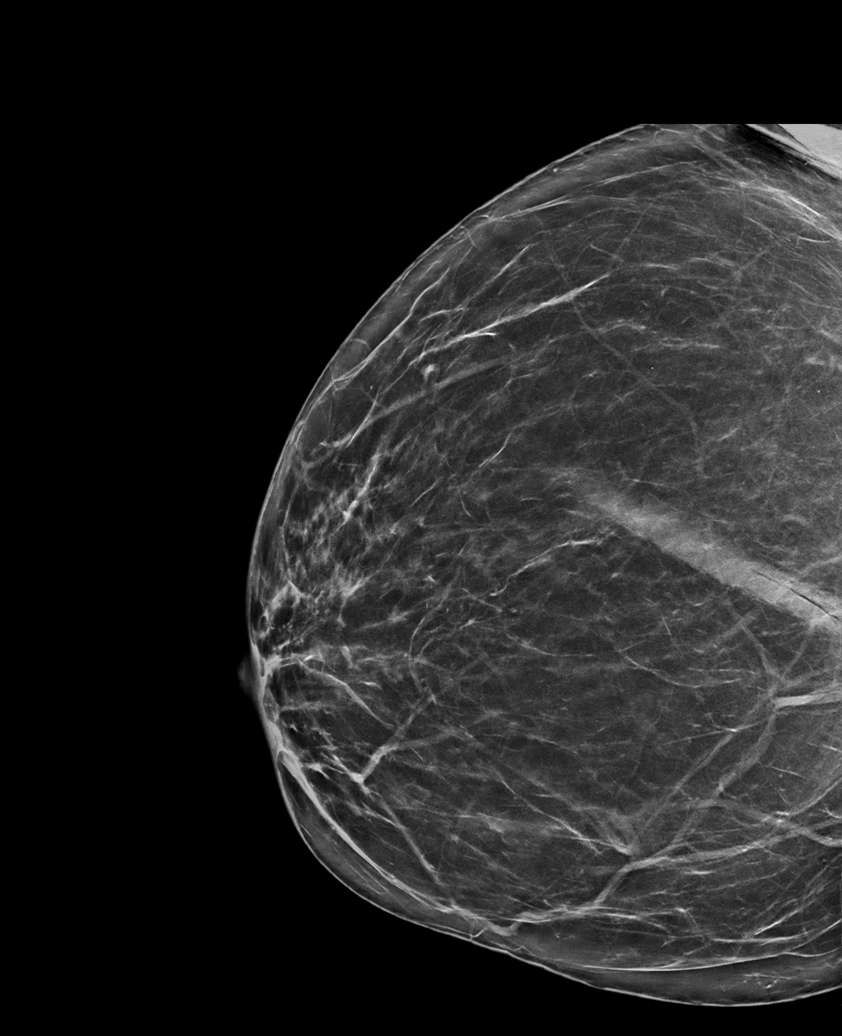

[L CC synth-2D]
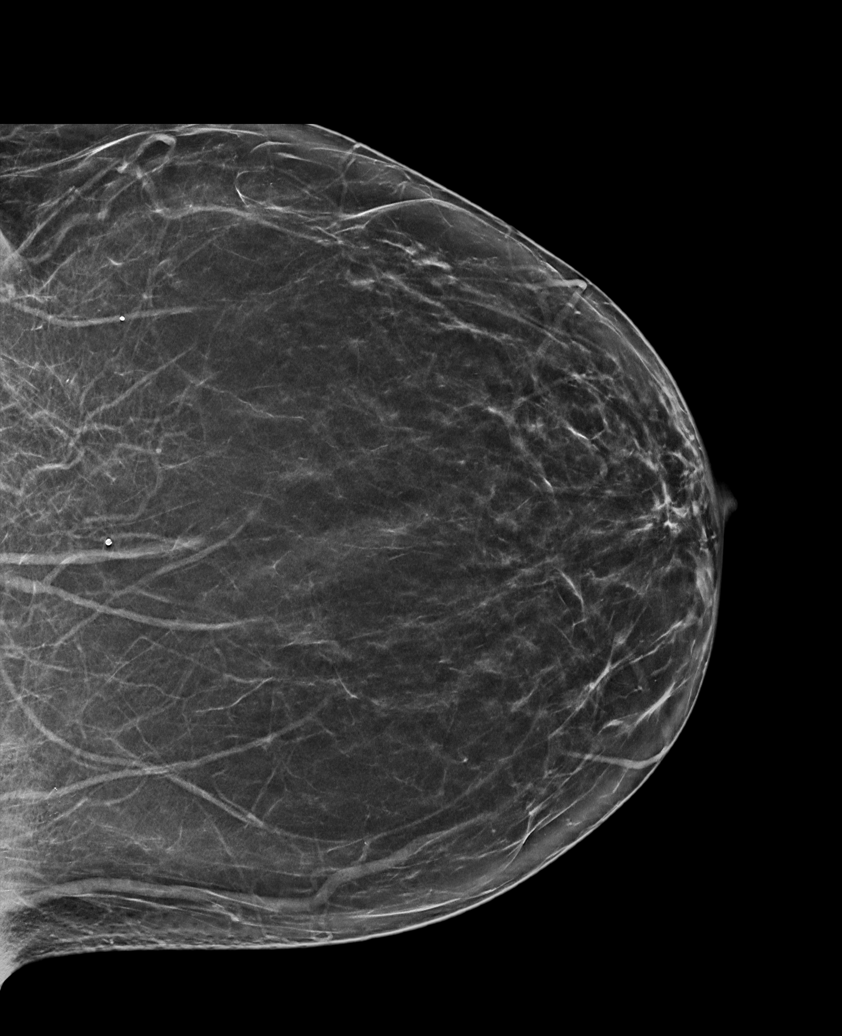

[L MLO tomo · tomo slice 43/86.0]
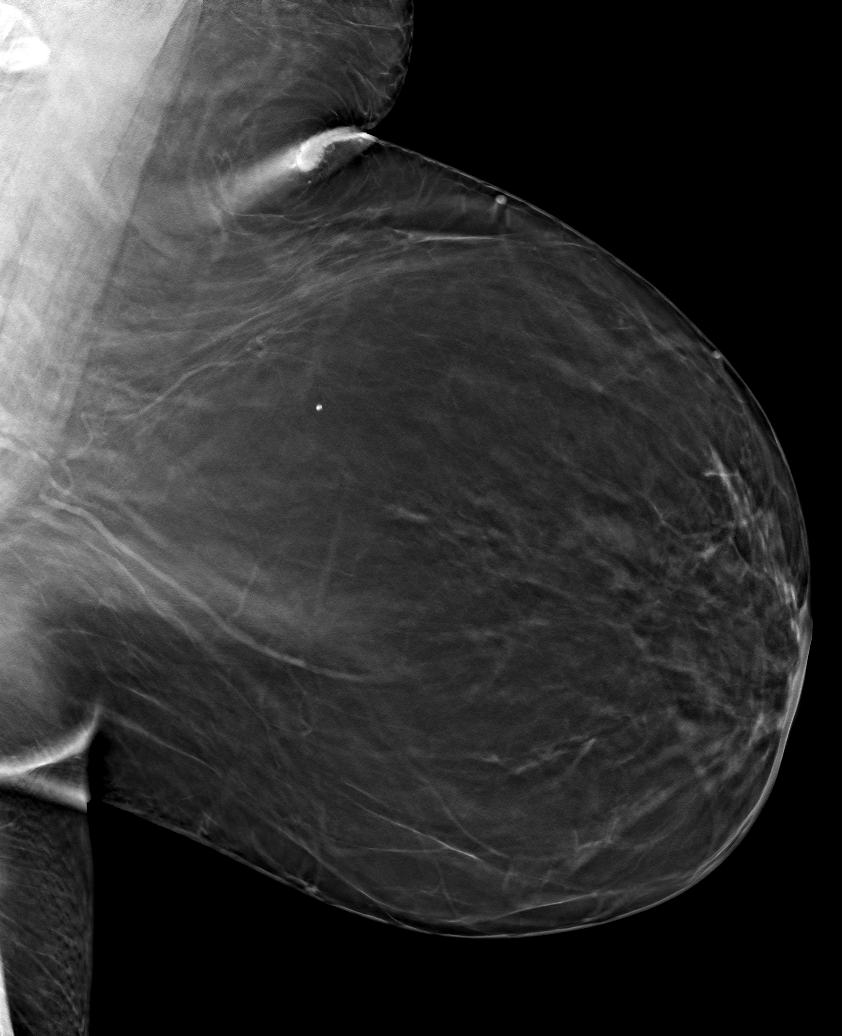

[6 of 30 positions shown; findings below may reference images not displayed]

FINDINGS: There are no findings suspicious for malignancy. Images were
processed with CAD.
IMPRESSION: No mammographic evidence of malignancy. A result letter of this
screening mammogram will be mailed directly to the patient.

RECOMMENDATION:
Screening mammogram in one year. (Code:[id number]-[id number])

BI-RADS CATEGORY  1: Negative.

## 2019-06-10 ENCOUNTER — Encounter (HOSPITAL_COMMUNITY): Payer: Self-pay

## 2019-10-31 IMAGING — CR DG CHEST 2V
2 series · 2 of 2 positions shown · non-contrast
Comparison: Chest x-ray of October 23, 2015

CLINICAL DATA: Preoperative evaluation prior to bariatric surgery.

EXAM:
CHEST  2 VIEW

[w chest pa]
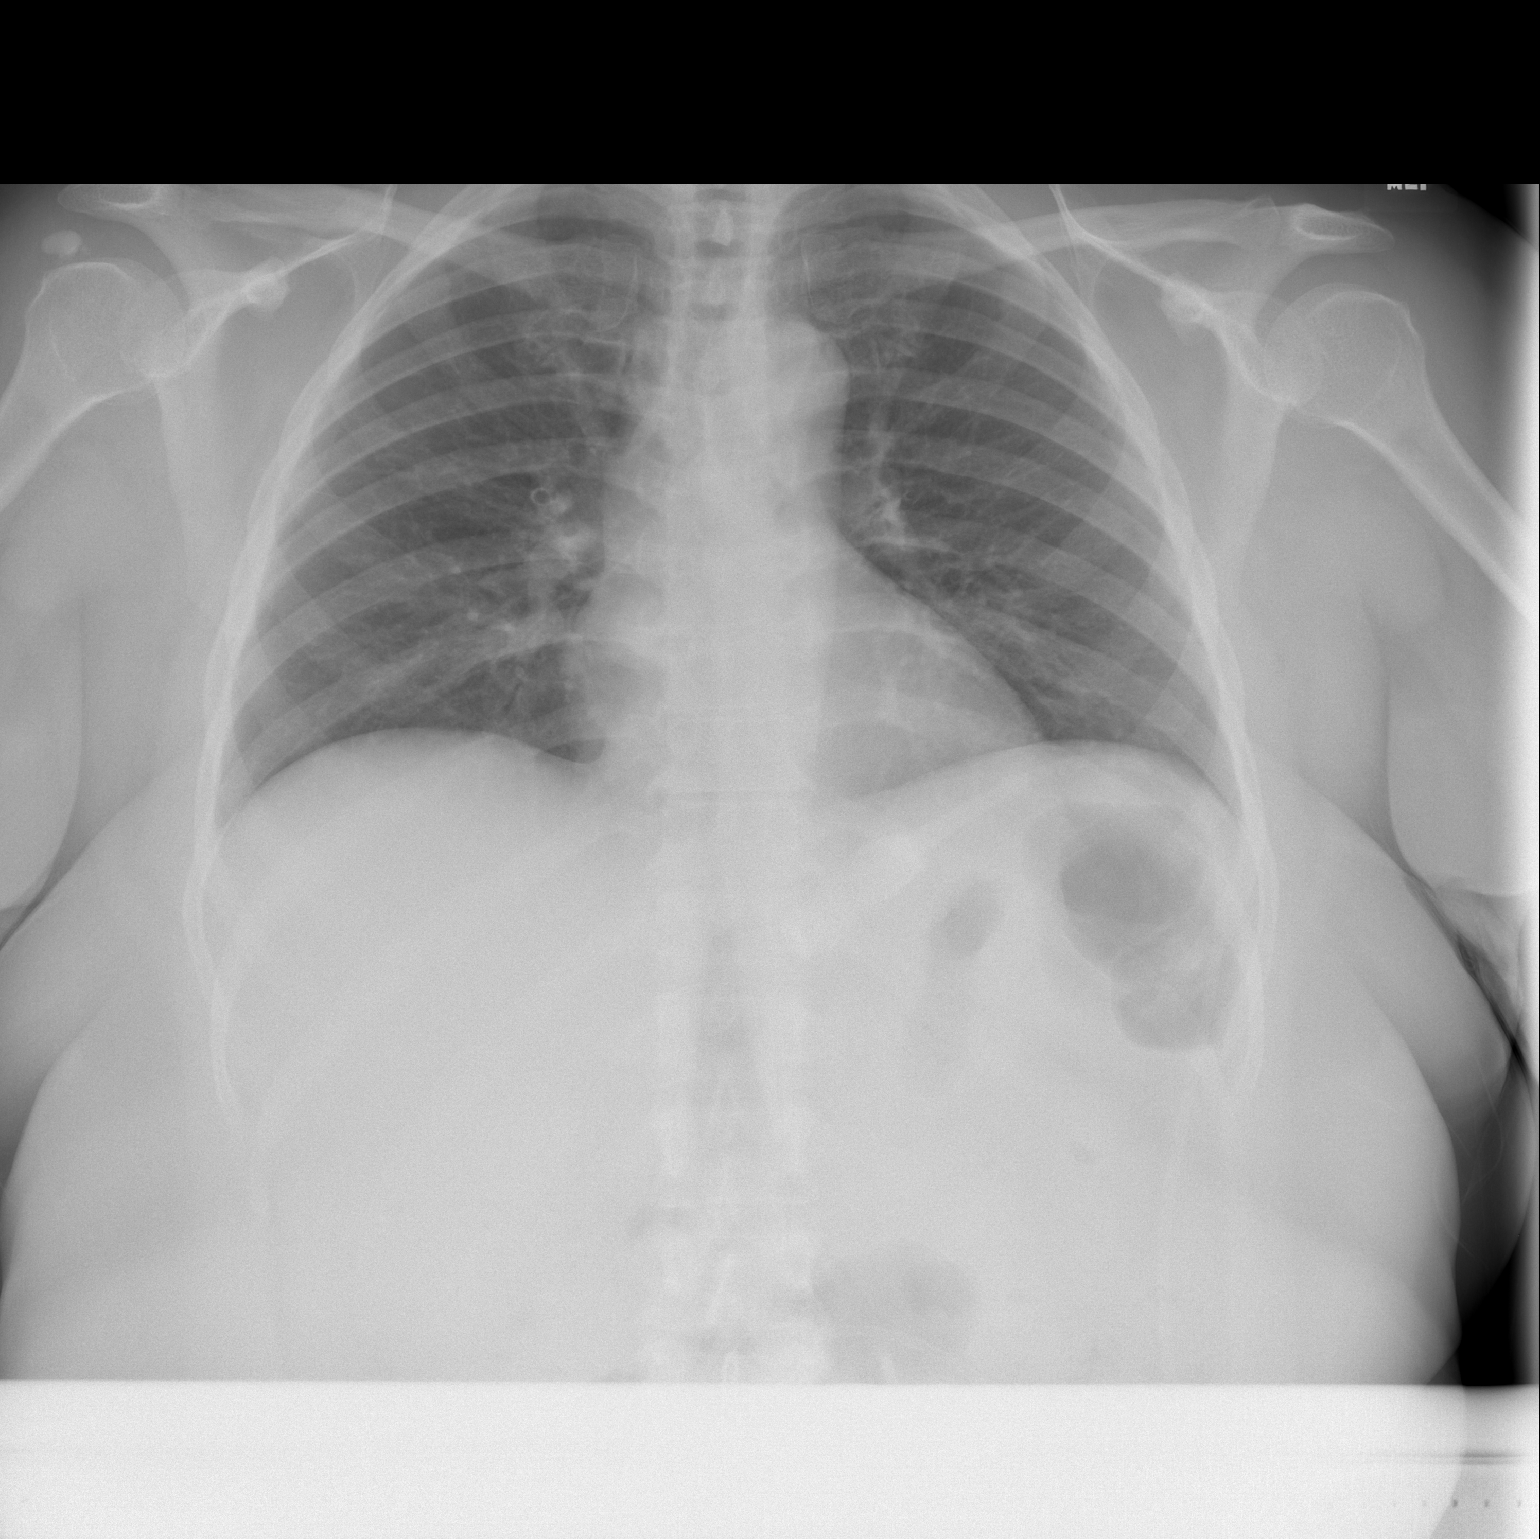

[w chest lat]
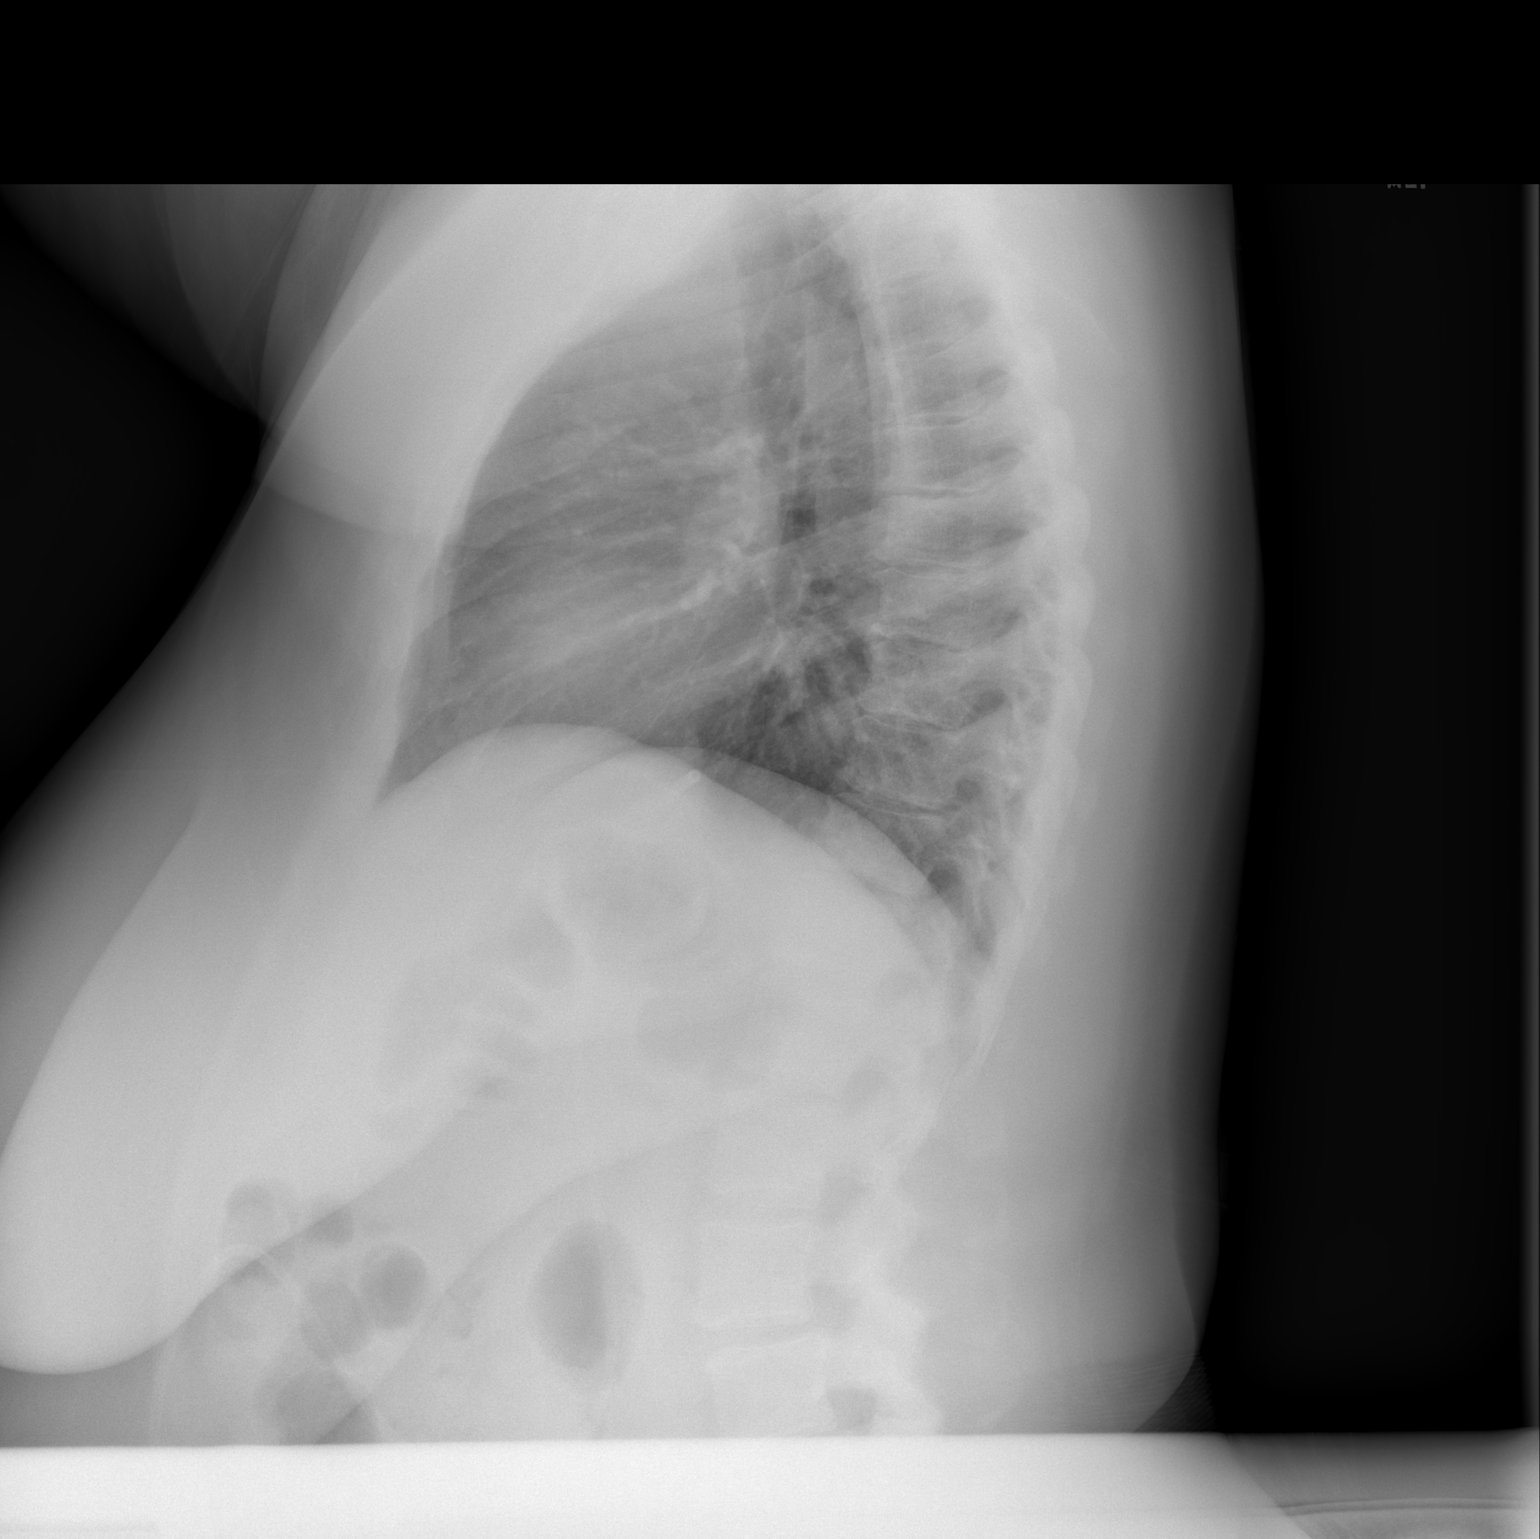

[2 of 2 positions shown; findings below may reference images not displayed]

FINDINGS: The lungs are adequately inflated and clear. The heart and
mediastinal structures are normal. There is no pleural effusion. The
bony thorax exhibits no acute abnormality. There is calcification in
the subacromial subdeltoid space on the right. A laparoscopic band
device is present in the left upper quadrant of the abdomen.
IMPRESSION: No active cardiopulmonary disease.

## 2020-01-30 ENCOUNTER — Encounter (HOSPITAL_COMMUNITY): Payer: Self-pay

## 2020-01-30 ENCOUNTER — Emergency Department (HOSPITAL_COMMUNITY): Payer: BLUE CROSS/BLUE SHIELD

## 2020-01-30 ENCOUNTER — Emergency Department (HOSPITAL_COMMUNITY)
Admission: EM | Admit: 2020-01-30 | Discharge: 2020-01-30 | Disposition: A | Discharge status: Home / Self Care | Payer: BLUE CROSS/BLUE SHIELD | Attending: Emergency Medicine | Admitting: Emergency Medicine

## 2020-01-30 DIAGNOSIS — R109 Unspecified abdominal pain: Secondary | ICD-10-CM | POA: Diagnosis present

## 2020-01-30 DIAGNOSIS — D7389 Other diseases of spleen: Secondary | ICD-10-CM | POA: Diagnosis not present

## 2020-01-30 DIAGNOSIS — N200 Calculus of kidney: Secondary | ICD-10-CM | POA: Insufficient documentation

## 2020-01-30 DIAGNOSIS — R922 Inconclusive mammogram: Secondary | ICD-10-CM

## 2020-01-30 LAB — BASIC METABOLIC PANEL
Anion gap: 6 (ref 5–15)
BUN: 13 mg/dL (ref 6–20)
CO2: 25 mmol/L (ref 22–32)
Calcium: 8.7 mg/dL — ABNORMAL LOW (ref 8.9–10.3)
Chloride: 110 mmol/L (ref 98–111)
Creatinine, Ser: 0.73 mg/dL (ref 0.44–1.00)
GFR calc Af Amer: >60 mL/min (ref >60)
GFR calc non Af Amer: >60 mL/min (ref >60)
Glucose, Bld: 91 mg/dL (ref 70–99)
Potassium: 4.1 mmol/L (ref 3.5–5.1)
Sodium: 141 mmol/L (ref 135–145)

## 2020-01-30 LAB — URINALYSIS, ROUTINE W REFLEX MICROSCOPIC
Bilirubin Urine: NEGATIVE
Glucose, UA: NEGATIVE mg/dL
Hgb urine dipstick: NEGATIVE
Ketones, ur: NEGATIVE mg/dL
Leukocytes,Ua: NEGATIVE
Nitrite: NEGATIVE
Protein, ur: NEGATIVE mg/dL
Specific Gravity, Urine: 1.031 — ABNORMAL HIGH (ref 1.005–1.030)
pH: 5 (ref 5.0–8.0)

## 2020-01-30 LAB — CBC
HCT: 41.5 % (ref 36.0–46.0)
Hemoglobin: 13.2 g/dL (ref 12.0–15.0)
MCH: 31.6 pg (ref 26.0–34.0)
MCHC: 31.8 g/dL (ref 30.0–36.0)
MCV: 99.3 fL (ref 80.0–100.0)
Platelets: 210 10*3/uL (ref 150–400)
RBC: 4.18 MIL/uL (ref 3.87–5.11)
RDW: 13 % (ref 11.5–15.5)
WBC: 5.6 10*3/uL (ref 4.0–10.5)
nRBC: 0 % (ref 0.0–0.2)

## 2020-01-30 LAB — I-STAT BETA HCG BLOOD, ED (MC, WL, AP ONLY): I-stat hCG, quantitative: <5 m[IU]/mL (ref <5)

## 2020-01-30 MED ORDER — OXYCODONE-ACETAMINOPHEN 5-325 MG PO TABS
1.0000 | ORAL_TABLET | Freq: Once | ORAL | Status: AC
  Administered 2020-01-30: 1 via ORAL
  Filled 2020-01-30: qty 1

## 2020-01-30 MED ORDER — TAMSULOSIN HCL 0.4 MG PO CAPS: 0.4000 mg | ORAL_CAPSULE | Freq: Every day | ORAL | 0 refills | Status: AC

## 2020-01-30 MED ORDER — HYDROCODONE-ACETAMINOPHEN 5-325 MG PO TABS
1.0000 | ORAL_TABLET | Freq: Four times a day (QID) | ORAL | 0 refills | Status: AC | PRN
Start: 2020-01-30 — End: ?

## 2020-01-30 NOTE — ED Triage Notes (Signed)
Pt reports right lower back pain that radiates to her right flank for the past 2 days. Pt seen at [facility] and sent here for CT scan to rule out a kidney stone. Denies n/v/d. Denies any recent injury/trauma

## 2020-01-30 NOTE — Discharge Instructions (Addendum)
I would recommend continued use of ibuprofen for management of your pain throughout the day.  You can follow the instructions on the bottle.  I have prescribed you Vicodin.  This is a strong narcotic.  I would only take it if you are having breakthrough pain.  Do not mix with alcohol.  I additionally am prescribing you Flomax.  This will cause you to have increased urinary frequency.  This can help with passing of your kidney stone.  Take it once in the morning.  Once you felt your symptoms have alleviated, you can stop taking this medication.  Please remember that we did notice a lesion on your spleen.  I would recommend following up with your primary care provider so they can see your CT scan findings.  They should plan on scheduling an MRI in 6 to 12 months to follow-up on this finding.  Since you have a mammogram in 2 weeks, I would recommend reaching out to them to let them know about the finding noted on your left breast.  Please return to the emergency department if you develop any new or worsening symptoms.  It was a pleasure to meet you.

## 2020-01-30 NOTE — ED Provider Notes (Signed)
[facility] EMERGENCY DEPARTMENT Provider Note   CSN: [id number] Arrival date & time: 01/30/20  1243     History Chief Complaint  Patient presents with  . Back Pain  . Flank Pain    Tiffany Ford is a 49 y.o. female.  HPI   Patient is a 49 year old female with a medical history as noted below.  Patient states she started experiencing moderate pain in her right flank and right lateral abdomen last night.  She was able to sleep well last night but woke this morning and when reaching above her head began experiencing exquisite pain in the prior mentioned regions.  She was evaluated at urgent care and recommended that she go to the emergency department for CT scan to rule out kidney stone.  She was given a Percocet when she first arrived which provided a mild relief of her pain.  Her current pain is 8/10.  She denies a history of similar symptoms.  No history of kidney stones.  She denies fevers, chills, nausea, vomiting, diarrhea, constipation, difficulty urinating, dysuria, hematuria, syncope.     History reviewed. No pertinent past medical history.  Patient Active Problem List   Diagnosis Date Noted  . Complication of gastric banding 12/04/2017    Past Surgical History:  Procedure Laterality Date  . CHOLECYSTECTOMY N/A 12/04/2017   Procedure: LAPAROSCOPIC CHOLECYSTECTOMY WITH INTRAOPERATIVE CHOLANGIOGRAM;  Surgeon: Glenna Fellows, MD;  Location: [facility];  Service: General;  Laterality: N/A;  . LAPAROSCOPIC GASTRIC BAND REMOVAL WITH LAPAROSCOPIC GASTRIC SLEEVE RESECTION N/A 12/04/2017   Procedure: LAPAROSCOPIC GASTRIC BAND REMOVAL WITH LAPAROSCOPIC GASTRIC SLEEVE RESECTION AND UPPER ENDOSCOPY;  Surgeon: Glenna Fellows, MD;  Location: [facility];  Service: General;  Laterality: N/A;  . LAPAROSCOPIC GASTRIC BANDING       OB History   No obstetric history on file.     Family History  Problem Relation Age of Onset  . Hypertension Other   . Diabetes Other     . Heart disease Other     Social History   Tobacco Use  . Smoking status: Never Smoker  . Smokeless tobacco: Never Used  Vaping Use  . Vaping Use: Never used  Substance Use Topics  . Alcohol use: No  . Drug use: No    Home Medications Prior to Admission medications   Medication Sig Start Date End Date Taking? Authorizing Provider  methocarbamol (ROBAXIN) 500 MG tablet Take 1 tablet by mouth 2 (two) times daily as needed. 10/28/17   [provider]  oxyCODONE (ROXICODONE) 5 MG/5ML solution Take 5-10 mLs (5-10 mg total) by mouth every 4 (four) hours as needed for moderate pain or severe pain. 12/06/17   Glenna Fellows, MD    Allergies    Patient has no known allergies.  Review of Systems   Review of Systems  All other systems reviewed and are negative. Ten systems reviewed and are negative for acute change, except as noted in the HPI.    Physical Exam Updated Vital Signs BP 131/75 (BP Location: Left Arm)   Pulse (!) 57   Temp 98.2 F (36.8 C) (Oral)   Resp 16   Ht 4\' 11"  (1.499 m)   Wt 78 kg   LMP 01/09/2020   SpO2 100%   BMI 34.74 kg/m   Physical Exam Vitals and nursing note reviewed.  Constitutional:      General: She is in acute distress.     Appearance: Normal appearance. She is not ill-appearing, toxic-appearing or  diaphoretic.  HENT:     Head: Normocephalic and atraumatic.     Right Ear: External ear normal.     Left Ear: External ear normal.     Nose: Nose normal.     Mouth/Throat:     Mouth: Mucous membranes are moist.     Pharynx: Oropharynx is clear. No oropharyngeal exudate or posterior oropharyngeal erythema.  Eyes:     Extraocular Movements: Extraocular movements intact.  Cardiovascular:     Rate and Rhythm: Normal rate and regular rhythm.     Pulses: Normal pulses.     Heart sounds: Normal heart sounds. No murmur heard.  No friction rub. No gallop.   Pulmonary:     Effort: Pulmonary effort is normal. No respiratory distress.      Breath sounds: Normal breath sounds. No stridor. No wheezing, rhonchi or rales.  Abdominal:     General: Abdomen is flat. There is no distension.     Palpations: Abdomen is soft.     Tenderness: There is abdominal tenderness. There is right CVA tenderness. There is no left CVA tenderness, guarding or rebound.     Comments: Mild TTP noted along the central right lateral abdomen.  Abdomen is soft.  Remaining regions of abdomen are nontender.  Right CVA tenderness noted with gentle fist strike.  No left CVA tenderness.  No midline spinal tenderness.  Musculoskeletal:        General: Normal range of motion.     Cervical back: Normal range of motion and neck supple. No tenderness.     Right lower leg: No edema.     Left lower leg: No edema.  Skin:    General: Skin is warm and dry.  Neurological:     General: No focal deficit present.     Mental Status: She is alert and oriented to person, place, and time.  Psychiatric:        Mood and Affect: Mood normal.        Behavior: Behavior normal.    ED Results / Procedures / Treatments   Labs (all labs ordered are listed, but only abnormal results are displayed) Labs Reviewed  BASIC METABOLIC PANEL - Abnormal; Notable for the following components:      Result Value   Calcium 8.7 (*)    All other components within normal limits  CBC  URINALYSIS, ROUTINE W REFLEX MICROSCOPIC  I-STAT BETA HCG BLOOD, ED ([facility], [facility], [facility] ONLY)   EKG None  Radiology CT Renal Stone Study  Result Date: 01/30/2020 CLINICAL DATA:  Flank pain. Right lower back pain with associated right lower quadrant pain. EXAM: CT ABDOMEN AND PELVIS WITHOUT CONTRAST TECHNIQUE: Multidetector CT imaging of the abdomen and pelvis was performed following the standard protocol without IV contrast. COMPARISON:  None. FINDINGS: Lower chest: There is questionable increased asymmetric density in the subareolar region of the left breast. This is only partially visualized on this exam.The  visualized lung bases are clear. The visualized heart is unremarkable with respect to size. Hepatobiliary: The liver is normal. Normal gallbladder.There is no biliary ductal dilation. Pancreas: Normal contours without ductal dilatation. No peripancreatic fluid collection. Spleen: There is a hypoattenuating 2.6 cm lesion in the spleen measuring approximately 22 Hounsfield units. The margins of this lesion appears somewhat indistinct but are difficult to fully evaluate. Adrenals/Urinary Tract: --Adrenal glands: Unremarkable. --Right kidney/ureter: There is a 2-3 mm nonobstructing stone in the lower pole the right kidney. Ther there is no right-sided hydronephrosis. --Left kidney/ureter: No hydronephrosis  or radiopaque kidney stones. --Urinary bladder: Unremarkable. Stomach/Bowel: --Stomach/Duodenum: Patient is status post prior sleeve gastrectomy. --Small bowel: Unremarkable. --Colon: Unremarkable. --Appendix: Normal. Vascular/Lymphatic: Normal course and caliber of the major abdominal vessels. --No retroperitoneal lymphadenopathy. --No mesenteric lymphadenopathy. --No pelvic or inguinal lymphadenopathy. Reproductive: Unremarkable Other: No ascites or free air. There is a small fat containing umbilical hernia. Musculoskeletal. No acute displaced fractures. IMPRESSION: 1. There is a 2-3 mm nonobstructing stone in the lower pole the right kidney. No hydronephrosis. 2. Normal appendix in the right lower quadrant. 3. There is a 2.6 cm hypoattenuating lesion in the spleen. This lesion is indeterminate on this examination. A follow-up contrast enhanced MRI is recommended in 6-12 months for further evaluation. 4. Questionable increased asymmetric density in the subareolar region of the left breast. This is only partially visualized on this exam. Follow-up with outpatient mammography is recommended. Electronically Signed   By: Katherine Mantle M.D.   On: 01/30/2020 17:22    Procedures Procedures (including critical care  time)  Medications Ordered in ED Medications  oxyCODONE-acetaminophen (PERCOCET/ROXICET) 5-325 MG per tablet 1 tablet (1 tablet Oral Given 01/30/20 1254)  oxyCODONE-acetaminophen (PERCOCET/ROXICET) 5-325 MG per tablet 1 tablet (1 tablet Oral Given 01/30/20 1738)   ED Course  I have reviewed the triage vital signs and the nursing notes.  Pertinent labs & imaging results that were available during my care of the patient were reviewed by me and considered in my medical decision making (see chart for details).  Clinical Course as of Jan 30 1855  Thu Jan 30, 2020  1834 No hemoglobin or RBCs.  No pyuria.  Urinalysis, Routine w reflex microscopic- may I&O cath if menses(!) [LJ]  1854 No leukocytosis  CBC [LJ]    Clinical Course User Index [LJ] Placido Sou, PA-C   MDM Rules/Calculators/A&P                          Patient is a 49 year old female with history and physical exam as noted above.  Patient was initially seen in urgent care but sent to the emergency department for a CT scan to rule out kidney stone.  I obtained a CT scan showing a 2 to 3 mm nonobstructing stone in the lower pole of the right kidney.  No hydronephrosis.  Normal appendix.  They did incidentally find a 2.6 cm hypoattenuating lesion on the spleen.  They recommend follow-up contrast-enhanced MRI in 6 to 12 months.  Additionally they found an asymmetric density in the subareolar region of the left breast.  I discussed these findings as well as her lab results with the patient in detail.  We will plan on discharging her with Flomax and Vicodin.  Recommended ibuprofen for regular pain management throughout the day.  Recommended increased fluid intake.  She is currently being set up with a new primary care provider.  I recommended that she discuss her CT scan findings with her PCP to schedule follow-up imaging.  She has a mammogram scheduled on the 15th of this month.  Recommended reaching out to them prior to discuss the CT  scan finding as well.  Her questions were answered and she was amicable at the time of discharge.  Her vital signs are stable.  Patient discharged to home/self care.  Condition at discharge: Stable  Note: Portions of this report may have been transcribed using voice recognition software. Every effort was made to ensure accuracy; however, inadvertent computerized transcription errors may be present.  Final Clinical Impression(s) / ED Diagnoses Final diagnoses:  Right flank pain  Kidney stone  Splenic lesion  Breast density    Rx / DC Orders ED Discharge Orders         Ordered    HYDROcodone-acetaminophen (NORCO/VICODIN) 5-325 MG tablet  Every 6 hours PRN     Discontinue  Reprint     01/30/20 1851    tamsulosin (FLOMAX) 0.4 MG CAPS capsule  Daily after breakfast     Discontinue  Reprint     01/30/20 1851           Placido Sou, PA-C 01/30/20 1856    Sabas Sous, MD 01/31/20 1615

## 2020-02-05 ENCOUNTER — Emergency Department (HOSPITAL_COMMUNITY)
Admission: EM | Admit: 2020-02-05 | Discharge: 2020-02-05 | Disposition: A | Discharge status: Home / Self Care | Payer: BLUE CROSS/BLUE SHIELD | Attending: Emergency Medicine | Admitting: Emergency Medicine

## 2020-02-05 ENCOUNTER — Other Ambulatory Visit: Payer: Self-pay

## 2020-02-05 ENCOUNTER — Encounter (HOSPITAL_COMMUNITY): Payer: Self-pay

## 2020-02-05 DIAGNOSIS — R109 Unspecified abdominal pain: Secondary | ICD-10-CM

## 2020-02-05 DIAGNOSIS — E669 Obesity, unspecified: Secondary | ICD-10-CM | POA: Diagnosis not present

## 2020-02-05 DIAGNOSIS — Z6834 Body mass index (BMI) 34.0-34.9, adult: Secondary | ICD-10-CM | POA: Diagnosis not present

## 2020-02-05 DIAGNOSIS — R1031 Right lower quadrant pain: Secondary | ICD-10-CM | POA: Insufficient documentation

## 2020-02-05 NOTE — ED Triage Notes (Signed)
Pt reports right sided lower back pain, pt diagnosed with kidney stones last week, given pain medication without relief. Pt denies n/v.

## 2020-02-05 NOTE — Discharge Instructions (Signed)
You are seen today for flank pain.  I reviewed your CT scan which showed that you have a kidney stone but the stone is still sitting in your kidney and not obstructing anything so it should not be causing your pain.  Your pain may be from a muscular strain versus gas.  You should follow-up with your primary care doctor so that they can order an MRI and a mammogram for the incidental findings on your spleen and in your left breast.  If you have any new or worsening symptoms and please return to the emergency department.

## 2020-02-05 NOTE — ED Provider Notes (Signed)
[facility] EMERGENCY DEPARTMENT Provider Note   CSN: [id number] Arrival date & time: 02/05/20  1157     History Chief Complaint  Patient presents with  . Flank Pain  . Back Pain    Tiffany Ford is a 49 y.o. female.  Patient is a 49 y/o F with PMH HTN, diabetes, heart disease presenting to the ER for right flank pain which is intermittent. Patient was seen 7/1 for the same and had labs and CT renal study. She was told she had a 2-50mm NONobstructing stone in the right kidney as well as some other incidental findings but no acute cause of her pain. She returns today stating that she still has not passed her kidney stone because she is still having intermittent pain. She reports pain is not present at the moment and she has not had severe enough pain to take her prescribed norco yet. However, the pain returns occasionally with certain movements and laying a certain wait. Denies dysuria, hematuria, fever,chills, n/v/d. Pain is not worse with eating or drinking.         History reviewed. No pertinent past medical history.  Patient Active Problem List   Diagnosis Date Noted  . Complication of gastric banding 12/04/2017    Past Surgical History:  Procedure Laterality Date  . CHOLECYSTECTOMY N/A 12/04/2017   Procedure: LAPAROSCOPIC CHOLECYSTECTOMY WITH INTRAOPERATIVE CHOLANGIOGRAM;  Surgeon: Glenna Fellows, MD;  Location: [facility];  Service: General;  Laterality: N/A;  . LAPAROSCOPIC GASTRIC BAND REMOVAL WITH LAPAROSCOPIC GASTRIC SLEEVE RESECTION N/A 12/04/2017   Procedure: LAPAROSCOPIC GASTRIC BAND REMOVAL WITH LAPAROSCOPIC GASTRIC SLEEVE RESECTION AND UPPER ENDOSCOPY;  Surgeon: Glenna Fellows, MD;  Location: [facility];  Service: General;  Laterality: N/A;  . LAPAROSCOPIC GASTRIC BANDING       OB History   No obstetric history on file.     Family History  Problem Relation Age of Onset  . Hypertension Other   . Diabetes Other   . Heart disease Other      Social History   Tobacco Use  . Smoking status: Never Smoker  . Smokeless tobacco: Never Used  Vaping Use  . Vaping Use: Never used  Substance Use Topics  . Alcohol use: No  . Drug use: No    Home Medications Prior to Admission medications   Medication Sig Start Date End Date Taking? Authorizing Provider  HYDROcodone-acetaminophen (NORCO/VICODIN) 5-325 MG tablet Take 1 tablet by mouth every 6 (six) hours as needed. 01/30/20   Placido Sou, PA-C  methocarbamol (ROBAXIN) 500 MG tablet Take 1 tablet by mouth 2 (two) times daily as needed. 10/28/17   [provider]  oxyCODONE (ROXICODONE) 5 MG/5ML solution Take 5-10 mLs (5-10 mg total) by mouth every 4 (four) hours as needed for moderate pain or severe pain. 12/06/17   Glenna Fellows, MD  tamsulosin (FLOMAX) 0.4 MG CAPS capsule Take 1 capsule (0.4 mg total) by mouth daily after breakfast for 10 days. 01/30/20 02/09/20  Placido Sou, PA-C    Allergies    Patient has no known allergies.  Review of Systems   Review of Systems  Constitutional: Negative for appetite change, chills and fever.  HENT: Negative.   Respiratory: Negative for shortness of breath.   Cardiovascular: Negative for chest pain.  Gastrointestinal: Negative for abdominal pain, diarrhea, nausea and vomiting.  Genitourinary: Positive for flank pain. Negative for dysuria, frequency, hematuria and pelvic pain.  Musculoskeletal: Positive for back pain.  Skin: Negative for rash.  Neurological: Negative  for dizziness.    Physical Exam Updated Vital Signs BP 114/81   Pulse 70   Temp 98.4 F (36.9 C)   Resp 16   Ht 4\' 11"  (1.499 m)   Wt 78 kg   LMP 01/09/2020   SpO2 98%   BMI 34.74 kg/m   Physical Exam Vitals and nursing note reviewed.  Constitutional:      General: She is not in acute distress.    Appearance: Normal appearance. She is obese. She is not ill-appearing, toxic-appearing or diaphoretic.  HENT:     Head: Normocephalic.  Eyes:      Conjunctiva/sclera: Conjunctivae normal.  Cardiovascular:     Rate and Rhythm: Normal rate and regular rhythm.  Pulmonary:     Effort: Pulmonary effort is normal.  Abdominal:     General: Abdomen is flat.     Palpations: Abdomen is soft.     Tenderness: There is no abdominal tenderness. There is no right CVA tenderness, left CVA tenderness, guarding or rebound.  Musculoskeletal:        General: No swelling, tenderness, deformity or signs of injury.  Skin:    General: Skin is dry.  Neurological:     Mental Status: She is alert.  Psychiatric:        Mood and Affect: Mood normal.     ED Results / Procedures / Treatments   Labs (all labs ordered are listed, but only abnormal results are displayed) Labs Reviewed - No data to display  EKG None  Radiology No results found.  Procedures Procedures (including critical care time)  Medications Ordered in ED Medications - No data to display  ED Course  I have reviewed the triage vital signs and the nursing notes.  Pertinent labs & imaging results that were available during my care of the patient were reviewed by me and considered in my medical decision making (see chart for details).  Clinical Course as of Feb 04 1549  Wed Feb 05, 2020  1547 Patient presenting for concern of kidney stone pain.  She was seen on the first of this month for intermittent flank pain and a CT was performed and showed a nonobstructing 2 to 3 mm stone.  She presents today because she is still having intermittent pain in her right flank.  Her HPI is consistent with more musculoskeletal strain.  She has a completely normal physical exam and is not experiencing pain here today in the ED.  I explained to the patient what a nonobstructing stone is.  I also explained to the patient the other incidental findings on her CT scan on the first.  We discussed proper follow-up with primary medical doctor for mammogram and MRI.  Her pain seems to be well controlled as  she reports she is not needing to take any pain medication at home.  She reports that she was just concerned that she has not passed the stone yet.  She was offered reassurance and education.  I offered her a repeat CT scan as well as lab work.  However, she reports that she is reassured and which is like a work note for now and declines further work-up today.  She was advised on return precautions.   [KM]    Clinical Course User Index [KM] 1548   MDM Rules/Calculators/A&P                          Based on review of vitals,  medical screening exam, lab work and/or imaging, there does not appear to be an acute, emergent etiology for the patient's symptoms. Counseled pt on good return precautions and encouraged both PCP and ED follow-up as needed.  Prior to discharge, I also discussed incidental imaging findings with patient in detail and advised appropriate, recommended follow-up in detail.  Clinical Impression: 1. Flank pain     Disposition: Discharge  Prior to providing a prescription for a controlled substance, I independently reviewed the patient's recent prescription history on the [place] Controlled Substance Reporting System. The patient had no recent or regular prescriptions and was deemed appropriate for a brief, less than 3 day prescription of narcotic for acute analgesia.  This note was prepared with assistance of Conservation officer, historic buildings. Occasional wrong-word or sound-a-like substitutions may have occurred due to the inherent limitations of voice recognition software.  Final Clinical Impression(s) / ED Diagnoses Final diagnoses:  Flank pain    Rx / DC Orders ED Discharge Orders    None       Jeral Pinch 02/05/20 1550    Arby Barrette, MD 02/05/20 [phone]

## 2020-06-19 ENCOUNTER — Encounter (HOSPITAL_COMMUNITY): Payer: Self-pay

## 2021-07-02 ENCOUNTER — Encounter (HOSPITAL_COMMUNITY): Payer: Self-pay | Admitting: *Deleted

## 2022-07-08 ENCOUNTER — Encounter (HOSPITAL_COMMUNITY): Payer: Self-pay | Admitting: *Deleted

## 2022-12-25 ENCOUNTER — Ambulatory Visit (HOSPITAL_COMMUNITY)
Admission: EM | Admit: 2022-12-25 | Discharge: 2022-12-25 | Disposition: A | Discharge status: Home / Self Care | Payer: Worker's Compensation | Attending: Emergency Medicine | Admitting: Emergency Medicine

## 2022-12-25 ENCOUNTER — Encounter (HOSPITAL_COMMUNITY): Payer: Self-pay | Admitting: Emergency Medicine

## 2022-12-25 ENCOUNTER — Ambulatory Visit (INDEPENDENT_AMBULATORY_CARE_PROVIDER_SITE_OTHER): Payer: Worker's Compensation

## 2022-12-25 ENCOUNTER — Other Ambulatory Visit: Payer: Self-pay

## 2022-12-25 DIAGNOSIS — S9032XA Contusion of left foot, initial encounter: Secondary | ICD-10-CM | POA: Diagnosis not present

## 2022-12-25 MED ORDER — KETOROLAC TROMETHAMINE 30 MG/ML IJ SOLN
INTRAMUSCULAR | Status: AC
  Filled 2022-12-25: qty 1

## 2022-12-25 MED ORDER — KETOROLAC TROMETHAMINE 30 MG/ML IJ SOLN
30.0000 mg | Freq: Once | INTRAMUSCULAR | Status: AC
  Administered 2022-12-25: 30 mg via INTRAMUSCULAR

## 2022-12-25 NOTE — ED Triage Notes (Signed)
[employer] employee got hurt while transferring a pt on a heavy lift machine, when she move the machine down with the pt on her left foot got pinch with the machine. Pt able to ambulate to treatment room with some discomfort on the top of her foot.

## 2022-12-25 NOTE — Discharge Instructions (Addendum)
Your imaging was negative for fracture or dislocation.  Please rest, ice, compress and elevate your foot.  You can take anti-inflammatories, ibuprofen 800 mg every 8 hours as needed for pain and swelling, please take this with food.  If your symptoms persist beyond the next week or so, please follow-up with your orthopedic for further evaluation.

## 2022-12-25 NOTE — ED Provider Notes (Signed)
[facility]    CSN: [id number] Arrival date & time: 12/25/22  1022      History   Chief Complaint Chief Complaint  Patient presents with   Foot Injury    [employer] employee got hurt while transferring a pt on a heavy lift machine, when she move the machine down with the pt on her left foot got pinch with the machine. Pt able to ambulate to treatment room with some discomfort on the top of her foot.    HPI Tiffany Ford is a 52 y.o. female.   Patient presents to clinic for left foot injury that happened around 9 AM this morning.  She had a few coworkers were transferring the patient and Michiel Sites lift when the lift came down on her left foot.  She is ambulatory, does have some soft tissue swelling.  No obvious deformity.  Has not taken any medications, has not iced.  Denies previous injuries to this foot.  The history is provided by the patient and medical records.  Foot Injury   History reviewed. No pertinent past medical history.  Patient Active Problem List   Diagnosis Date Noted   Complication of gastric banding 12/04/2017    Past Surgical History:  Procedure Laterality Date   CHOLECYSTECTOMY N/A 12/04/2017   Procedure: LAPAROSCOPIC CHOLECYSTECTOMY WITH INTRAOPERATIVE CHOLANGIOGRAM;  Surgeon: Glenna Fellows, MD;  Location: [facility];  Service: General;  Laterality: N/A;   LAPAROSCOPIC GASTRIC BAND REMOVAL WITH LAPAROSCOPIC GASTRIC SLEEVE RESECTION N/A 12/04/2017   Procedure: LAPAROSCOPIC GASTRIC BAND REMOVAL WITH LAPAROSCOPIC GASTRIC SLEEVE RESECTION AND UPPER ENDOSCOPY;  Surgeon: Glenna Fellows, MD;  Location: [facility];  Service: General;  Laterality: N/A;   LAPAROSCOPIC GASTRIC BANDING      OB History   No obstetric history on file.      Home Medications    Prior to Admission medications   Medication Sig Start Date End Date Taking? Authorizing Provider  HYDROcodone-acetaminophen (NORCO/VICODIN) 5-325 MG tablet Take 1 tablet by mouth every 6 (six)  hours as needed. 01/30/20   Placido Sou, PA-C  methocarbamol (ROBAXIN) 500 MG tablet Take 1 tablet by mouth 2 (two) times daily as needed. 10/28/17   [provider]  oxyCODONE (ROXICODONE) 5 MG/5ML solution Take 5-10 mLs (5-10 mg total) by mouth every 4 (four) hours as needed for moderate pain or severe pain. 12/06/17   Glenna Fellows, MD    Family History Family History  Problem Relation Age of Onset   Hypertension Other    Diabetes Other    Heart disease Other     Social History Social History   Tobacco Use   Smoking status: Never   Smokeless tobacco: Never  Vaping Use   Vaping Use: Never used  Substance Use Topics   Alcohol use: No   Drug use: No     Allergies   Patient has no known allergies.   Review of Systems Review of Systems   Physical Exam Triage Vital Signs ED Triage Vitals  Enc Vitals Group     BP 12/25/22 1136 (!) 147/70     Pulse Rate 12/25/22 1136 64     Resp 12/25/22 1136 19     Temp 12/25/22 1136 97.9 F (36.6 C)     Temp Source 12/25/22 1136 Oral     SpO2 12/25/22 1136 96 %     Weight 12/25/22 1137 174 lb 2.6 oz (79 kg)     Height 12/25/22 1137 4\' 11"  (1.499 m)  Head Circumference --      Peak Flow --      Pain Score 12/25/22 1136 7     Pain Loc --      Pain Edu? --      Excl. in GC? --    No data found.  Updated Vital Signs BP (!) 147/70 (BP Location: Right Arm)   Pulse 64   Temp 97.9 F (36.6 C) (Oral)   Resp 19   Ht 4\' 11"  (1.499 m)   Wt 174 lb 2.6 oz (79 kg)   LMP 01/09/2020   SpO2 96%   BMI 35.18 kg/m   Visual Acuity Right Eye Distance:   Left Eye Distance:   Bilateral Distance:    Right Eye Near:   Left Eye Near:    Bilateral Near:     Physical Exam Vitals and nursing note reviewed.  Constitutional:      Appearance: Normal appearance.  HENT:     Head: Normocephalic and atraumatic.     Right Ear: External ear normal.     Left Ear: External ear normal.     Nose: Nose normal.      Mouth/Throat:     Mouth: Mucous membranes are moist.  Eyes:     Conjunctiva/sclera: Conjunctivae normal.  Cardiovascular:     Rate and Rhythm: Normal rate.     Pulses:          Dorsalis pedis pulses are 2+ on the left side.       Posterior tibial pulses are 2+ on the left side.  Pulmonary:     Effort: Pulmonary effort is normal. No respiratory distress.  Musculoskeletal:        General: Swelling, tenderness and signs of injury present. No deformity. Normal range of motion.     Left foot: Normal range of motion. No deformity.       Feet:  Feet:     Left foot:     Skin integrity: Skin integrity normal.     Comments: TTP over right 2nd and 3rd metatarsals  Skin:    General: Skin is warm and dry.     Capillary Refill: Capillary refill takes less than 2 seconds.  Neurological:     General: No focal deficit present.     Mental Status: She is alert and oriented to person, place, and time.  Psychiatric:        Mood and Affect: Mood normal.        Behavior: Behavior normal. Behavior is cooperative.      UC Treatments / Results  Labs (all labs ordered are listed, but only abnormal results are displayed) Labs Reviewed - No data to display  EKG   Radiology DG Foot Complete Left  Result Date: 12/25/2022 CLINICAL DATA:  Foot injury while transferring a patient. EXAM: LEFT FOOT - COMPLETE 3+ VIEW COMPARISON:  None Available. FINDINGS: There is no evidence of fracture or dislocation. There is no evidence of arthropathy or other focal bone abnormality. Soft tissues are unremarkable. IMPRESSION: Negative. Electronically Signed   By: Kennith Center M.D.   On: 12/25/2022 12:02    Procedures Procedures (including critical care time)  Medications Ordered in UC Medications  ketorolac (TORADOL) 30 MG/ML injection 30 mg (30 mg Intramuscular Given 12/25/22 1155)    Initial Impression / Assessment and Plan / UC Course  I have reviewed the triage vital signs and the nursing  notes.  Pertinent labs & imaging results that were available during my care of  the patient were reviewed by me and considered in my medical decision making (see chart for details).  Vitals and triage reviewed, patient is hemodynamically stable.  Left second and third metatarsals with tenderness and swelling.  No obvious deformity.  Range of motion and sensation intact, brisk capillary refill.  Imaging negative for fracture or dislocation.  Discussed RICE therapy for orthopedic injuries and orthopedic follow-up as needed.  Patient verbalized understanding, no questions at this time.     Final Clinical Impressions(s) / UC Diagnoses   Final diagnoses:  Contusion of left foot, initial encounter     Discharge Instructions      Your imaging was negative for fracture or dislocation.  Please rest, ice, compress and elevate your foot.  You can take anti-inflammatories, ibuprofen 800 mg every 8 hours as needed for pain and swelling, please take this with food.  If your symptoms persist beyond the next week or so, please follow-up with your orthopedic for further evaluation.     ED Prescriptions   None    PDMP not reviewed this encounter.   [place], [place], [place] 12/25/22 1217

## 2023-06-28 ENCOUNTER — Encounter (HOSPITAL_COMMUNITY): Payer: Self-pay | Admitting: *Deleted
